# Patient Record
Sex: Female | Born: 1935
Health system: Southern US, Community
[De-identification: ages and names within clinical notes are randomized; demographics above are authoritative.]

## PROBLEM LIST (undated history)

## (undated) DIAGNOSIS — I10 Essential (primary) hypertension: Secondary | ICD-10-CM

## (undated) DIAGNOSIS — K2210 Ulcer of esophagus without bleeding: Secondary | ICD-10-CM

## (undated) DIAGNOSIS — I8390 Asymptomatic varicose veins of unspecified lower extremity: Secondary | ICD-10-CM

## (undated) DIAGNOSIS — K649 Unspecified hemorrhoids: Secondary | ICD-10-CM

## (undated) DIAGNOSIS — M1990 Unspecified osteoarthritis, unspecified site: Secondary | ICD-10-CM

## (undated) DIAGNOSIS — H3530 Unspecified macular degeneration: Secondary | ICD-10-CM

## (undated) DIAGNOSIS — E785 Hyperlipidemia, unspecified: Secondary | ICD-10-CM

## (undated) DIAGNOSIS — K5790 Diverticulosis of intestine, part unspecified, without perforation or abscess without bleeding: Secondary | ICD-10-CM

## (undated) DIAGNOSIS — C801 Malignant (primary) neoplasm, unspecified: Secondary | ICD-10-CM

## (undated) DIAGNOSIS — K449 Diaphragmatic hernia without obstruction or gangrene: Secondary | ICD-10-CM

## (undated) DIAGNOSIS — H409 Unspecified glaucoma: Secondary | ICD-10-CM

## (undated) DIAGNOSIS — K222 Esophageal obstruction: Secondary | ICD-10-CM

## (undated) DIAGNOSIS — E119 Type 2 diabetes mellitus without complications: Secondary | ICD-10-CM

## (undated) DIAGNOSIS — K5190 Ulcerative colitis, unspecified, without complications: Secondary | ICD-10-CM

## (undated) DIAGNOSIS — K635 Polyp of colon: Secondary | ICD-10-CM

## (undated) DIAGNOSIS — H269 Unspecified cataract: Secondary | ICD-10-CM

## (undated) HISTORY — PX: CATARACT EXTRACTION: SUR2

## (undated) HISTORY — PX: COLONOSCOPY: SHX174

## (undated) HISTORY — DX: Diaphragmatic hernia without obstruction or gangrene: K44.9

## (undated) HISTORY — DX: Ulcerative colitis, unspecified, without complications: K51.90

## (undated) HISTORY — PX: WRIST SURGERY: SHX841

## (undated) HISTORY — DX: Essential (primary) hypertension: I10

## (undated) HISTORY — PX: TONSILLECTOMY AND ADENOIDECTOMY: SUR1326

## (undated) HISTORY — PX: TUBAL LIGATION: SHX77

## (undated) HISTORY — DX: Esophageal obstruction: K22.2

## (undated) HISTORY — DX: Ulcer of esophagus without bleeding: K22.10

## (undated) HISTORY — DX: Polyp of colon: K63.5

## (undated) HISTORY — PX: APPENDECTOMY: SHX54

## (undated) HISTORY — DX: Unspecified hemorrhoids: K64.9

## (undated) HISTORY — DX: Diverticulosis of intestine, part unspecified, without perforation or abscess without bleeding: K57.90

---

## 1964-01-21 HISTORY — PX: OVARIAN CYST REMOVAL: SHX89

## 1985-04-05 ENCOUNTER — Encounter (INDEPENDENT_AMBULATORY_CARE_PROVIDER_SITE_OTHER): Payer: Self-pay | Admitting: *Deleted

## 1993-01-09 ENCOUNTER — Encounter: Payer: Self-pay | Admitting: Internal Medicine

## 1993-01-16 ENCOUNTER — Encounter (INDEPENDENT_AMBULATORY_CARE_PROVIDER_SITE_OTHER): Payer: Self-pay | Admitting: *Deleted

## 1993-01-31 ENCOUNTER — Encounter: Payer: Self-pay | Admitting: Internal Medicine

## 1993-03-07 ENCOUNTER — Encounter: Payer: Self-pay | Admitting: Internal Medicine

## 1993-04-04 ENCOUNTER — Encounter: Payer: Self-pay | Admitting: Internal Medicine

## 1993-06-18 ENCOUNTER — Encounter: Payer: Self-pay | Admitting: Internal Medicine

## 1993-08-28 ENCOUNTER — Encounter: Payer: Self-pay | Admitting: Internal Medicine

## 1993-12-25 ENCOUNTER — Encounter: Payer: Self-pay | Admitting: Internal Medicine

## 1994-01-17 ENCOUNTER — Encounter: Payer: Self-pay | Admitting: Internal Medicine

## 1995-06-03 ENCOUNTER — Encounter: Payer: Self-pay | Admitting: Internal Medicine

## 1995-07-06 ENCOUNTER — Encounter: Payer: Self-pay | Admitting: Internal Medicine

## 1995-10-12 ENCOUNTER — Encounter: Payer: Self-pay | Admitting: Internal Medicine

## 1997-05-23 DIAGNOSIS — C4491 Basal cell carcinoma of skin, unspecified: Secondary | ICD-10-CM

## 1997-05-23 HISTORY — DX: Basal cell carcinoma of skin, unspecified: C44.91

## 1997-10-31 ENCOUNTER — Encounter: Payer: Self-pay | Admitting: Internal Medicine

## 1997-11-15 ENCOUNTER — Encounter: Payer: Self-pay | Admitting: Internal Medicine

## 1997-11-15 ENCOUNTER — Other Ambulatory Visit: Admission: RE | Admit: 1997-11-15 | Discharge: 1997-11-15 | Payer: Self-pay | Admitting: Internal Medicine

## 1997-11-16 ENCOUNTER — Encounter: Payer: Self-pay | Admitting: Internal Medicine

## 1998-07-03 ENCOUNTER — Other Ambulatory Visit: Admission: RE | Admit: 1998-07-03 | Discharge: 1998-07-03 | Payer: Self-pay | Admitting: Family Medicine

## 1999-02-13 ENCOUNTER — Encounter: Payer: Self-pay | Admitting: Internal Medicine

## 1999-09-13 ENCOUNTER — Other Ambulatory Visit: Admission: RE | Admit: 1999-09-13 | Discharge: 1999-09-13 | Payer: Self-pay | Admitting: Family Medicine

## 1999-12-31 ENCOUNTER — Encounter: Payer: Self-pay | Admitting: Internal Medicine

## 2000-02-12 ENCOUNTER — Other Ambulatory Visit: Admission: RE | Admit: 2000-02-12 | Discharge: 2000-02-12 | Payer: Self-pay | Admitting: Internal Medicine

## 2000-02-12 ENCOUNTER — Encounter (INDEPENDENT_AMBULATORY_CARE_PROVIDER_SITE_OTHER): Payer: Self-pay

## 2000-02-12 ENCOUNTER — Encounter: Payer: Self-pay | Admitting: Internal Medicine

## 2000-09-14 ENCOUNTER — Other Ambulatory Visit: Admission: RE | Admit: 2000-09-14 | Discharge: 2000-09-14 | Payer: Self-pay | Admitting: Family Medicine

## 2001-07-13 ENCOUNTER — Encounter: Payer: Self-pay | Admitting: Internal Medicine

## 2001-09-16 ENCOUNTER — Other Ambulatory Visit: Admission: RE | Admit: 2001-09-16 | Discharge: 2001-09-16 | Payer: Self-pay | Admitting: Family Medicine

## 2002-02-23 ENCOUNTER — Encounter: Payer: Self-pay | Admitting: Internal Medicine

## 2002-04-06 ENCOUNTER — Encounter: Payer: Self-pay | Admitting: Internal Medicine

## 2002-08-09 DIAGNOSIS — C4491 Basal cell carcinoma of skin, unspecified: Secondary | ICD-10-CM

## 2002-08-09 HISTORY — DX: Basal cell carcinoma of skin, unspecified: C44.91

## 2002-09-19 ENCOUNTER — Other Ambulatory Visit: Admission: RE | Admit: 2002-09-19 | Discharge: 2002-09-19 | Payer: Self-pay | Admitting: Family Medicine

## 2002-10-17 ENCOUNTER — Encounter: Admission: RE | Admit: 2002-10-17 | Discharge: 2002-10-17 | Payer: Self-pay | Admitting: Family Medicine

## 2002-10-17 ENCOUNTER — Encounter: Payer: Self-pay | Admitting: Family Medicine

## 2003-10-10 ENCOUNTER — Encounter: Payer: Self-pay | Admitting: Internal Medicine

## 2003-11-03 ENCOUNTER — Other Ambulatory Visit: Admission: RE | Admit: 2003-11-03 | Discharge: 2003-11-03 | Payer: Self-pay | Admitting: Family Medicine

## 2004-04-18 ENCOUNTER — Ambulatory Visit: Payer: Self-pay | Admitting: Internal Medicine

## 2004-04-29 ENCOUNTER — Ambulatory Visit: Payer: Self-pay | Admitting: Internal Medicine

## 2004-09-17 DIAGNOSIS — C4491 Basal cell carcinoma of skin, unspecified: Secondary | ICD-10-CM

## 2004-09-17 HISTORY — DX: Basal cell carcinoma of skin, unspecified: C44.91

## 2005-01-27 ENCOUNTER — Encounter: Admission: RE | Admit: 2005-01-27 | Discharge: 2005-01-27 | Payer: Self-pay | Admitting: Family Medicine

## 2006-03-03 ENCOUNTER — Other Ambulatory Visit: Admission: RE | Admit: 2006-03-03 | Discharge: 2006-03-03 | Payer: Self-pay | Admitting: Family Medicine

## 2006-03-23 ENCOUNTER — Ambulatory Visit: Payer: Self-pay | Admitting: Internal Medicine

## 2006-04-30 ENCOUNTER — Ambulatory Visit: Payer: Self-pay | Admitting: Internal Medicine

## 2006-05-14 ENCOUNTER — Encounter: Payer: Self-pay | Admitting: Internal Medicine

## 2006-05-14 ENCOUNTER — Ambulatory Visit: Payer: Self-pay | Admitting: Internal Medicine

## 2007-05-27 ENCOUNTER — Encounter: Payer: Self-pay | Admitting: Internal Medicine

## 2007-05-27 ENCOUNTER — Ambulatory Visit: Payer: Self-pay | Admitting: Internal Medicine

## 2007-05-27 DIAGNOSIS — K649 Unspecified hemorrhoids: Secondary | ICD-10-CM | POA: Insufficient documentation

## 2007-05-27 DIAGNOSIS — Z8719 Personal history of other diseases of the digestive system: Secondary | ICD-10-CM | POA: Insufficient documentation

## 2007-05-27 DIAGNOSIS — K5190 Ulcerative colitis, unspecified, without complications: Secondary | ICD-10-CM | POA: Insufficient documentation

## 2007-05-27 DIAGNOSIS — D126 Benign neoplasm of colon, unspecified: Secondary | ICD-10-CM | POA: Insufficient documentation

## 2008-06-22 ENCOUNTER — Telehealth: Payer: Self-pay | Admitting: Internal Medicine

## 2008-07-27 ENCOUNTER — Telehealth: Payer: Self-pay | Admitting: Internal Medicine

## 2009-01-20 DIAGNOSIS — H409 Unspecified glaucoma: Secondary | ICD-10-CM

## 2009-01-20 HISTORY — DX: Unspecified glaucoma: H40.9

## 2009-05-03 ENCOUNTER — Encounter: Payer: Self-pay | Admitting: Internal Medicine

## 2009-05-03 ENCOUNTER — Ambulatory Visit: Payer: Self-pay | Admitting: Internal Medicine

## 2009-05-03 DIAGNOSIS — K5190 Ulcerative colitis, unspecified, without complications: Secondary | ICD-10-CM | POA: Insufficient documentation

## 2009-05-03 DIAGNOSIS — R195 Other fecal abnormalities: Secondary | ICD-10-CM | POA: Insufficient documentation

## 2009-05-03 DIAGNOSIS — R198 Other specified symptoms and signs involving the digestive system and abdomen: Secondary | ICD-10-CM | POA: Insufficient documentation

## 2009-05-03 LAB — CONVERTED CEMR LAB
Alkaline Phosphatase: 53 units/L (ref 39–117)
BUN: 13 mg/dL (ref 6–23)
Basophils Absolute: 0 10*3/uL (ref 0.0–0.1)
Basophils Relative: 0.3 % (ref 0.0–3.0)
CO2: 29 meq/L (ref 19–32)
Chloride: 108 meq/L (ref 96–112)
Creatinine, Ser: 0.8 mg/dL (ref 0.4–1.2)
Eosinophils Absolute: 0.2 10*3/uL (ref 0.0–0.7)
Eosinophils Relative: 3.2 % (ref 0.0–5.0)
GFR calc non Af Amer: 74.45 mL/min (ref >60)
HCT: 36.5 % (ref 36.0–46.0)
Hemoglobin: 12.8 g/dL (ref 12.0–15.0)
Lymphs Abs: 1.5 10*3/uL (ref 0.7–4.0)
MCHC: 35 g/dL (ref 30.0–36.0)
MCV: 92.4 fL (ref 78.0–100.0)
Monocytes Absolute: 0.6 10*3/uL (ref 0.1–1.0)
Neutro Abs: 3.4 10*3/uL (ref 1.4–7.7)
RBC: 3.95 M/uL (ref 3.87–5.11)
Total Bilirubin: 0.4 mg/dL (ref 0.3–1.2)
Total Protein: 7 g/dL (ref 6.0–8.3)
WBC: 5.6 10*3/uL (ref 4.5–10.5)

## 2009-06-05 ENCOUNTER — Ambulatory Visit: Payer: Self-pay | Admitting: Internal Medicine

## 2009-06-08 ENCOUNTER — Encounter: Payer: Self-pay | Admitting: Internal Medicine

## 2009-09-14 ENCOUNTER — Other Ambulatory Visit: Admission: RE | Admit: 2009-09-14 | Discharge: 2009-09-14 | Payer: Self-pay | Admitting: Geriatric Medicine

## 2010-02-21 NOTE — Progress Notes (Signed)
Summary: [facility]  [facility]   Imported By: Lanelle Bal 05/04/2009 13:38:42  _____________________________________________________________________  External Attachment:    Type:   Image     Comment:   External Document

## 2010-02-21 NOTE — Progress Notes (Signed)
Summary: [facility]  [facility]   Imported By: Lanelle Bal 05/04/2009 13:30:03  _____________________________________________________________________  External Attachment:    Type:   Image     Comment:   External Document

## 2010-02-21 NOTE — Procedures (Signed)
Summary: Colonoscopy   Colonoscopy  Procedure date:  04/29/2004  Findings:      Location:  [facility].  Results: Colitis.        Patient Name: Rachel Henry, Rachel Henry. MRN:  Procedure Procedures: Colonoscopy CPT: [phone].    with biopsy. CPT: [id number].    with polypectomy. CPT: [id number].  Personnel: Endoscopist: Wilhemina Bonito. Marina Goodell, MD.  Exam Location: Exam performed in Outpatient Clinic. Outpatient  Patient Consent: Procedure, Alternatives, Risks and Benefits discussed, consent obtained, from patient. Consent was obtained by the RN.  Indications  Surveillance of: Adenomatous Polyp(s). Ulcerative Colitis.  History  Current Medications: Patient is not currently taking Coumadin.  Pre-Exam Physical: Performed Apr 29, 2004. Entire physical exam was normal.  Exam Exam: Extent of exam reached: Cecum, extent intended: Cecum.  The cecum was identified by appendiceal orifice and IC valve. Patient position: from side to side. Colon retroflexion performed. Images taken. ASA Classification: II. Tolerance: excellent.  Monitoring: Pulse and BP monitoring, Oximetry used. Supplemental O2 given.  Colon Prep Used MIRALAX for colon prep. Prep results: excellent.  Sedation Meds: Patient assessed and found to be appropriate for moderate (conscious) sedation. Fentanyl 75 mcg. given IV. Versed 10 mg. given IV.  Findings POLYP: Descending Colon, Maximum size: 2 mm. diminutive, Procedure:  biopsy without cautery, removed, retrieved, Polyp sent to pathology. ICD9: Colon Polyps: 211.3.  NORMAL EXAM: Cecum to Rectum. Biopsy/Normal Exam taken. Comments: BX x 8 from 4 areas of colon.  Internal hemorrhoids seen.   Assessment Abnormal examination, see findings above.  Diagnoses: 211.3: Colon Polyps.  556.4: Colitis, Pancolitic, Universal.   Events  Unplanned Interventions: No intervention was required.  Unplanned Events: There were no complications. Plans Medication Plan: Continue  current medications.  Disposition: After procedure patient sent to recovery. After recovery patient sent home.  Scheduling/Referral: Colonoscopy, to Wilhemina Bonito. Marina Goodell, MD, in 2 years,    This report was created from the original endoscopy report, which was reviewed and signed by the above listed endoscopist.   cc:  Bradd Canary, MD      The Patient

## 2010-02-21 NOTE — Progress Notes (Signed)
Summary: [facility]  [facility]   Imported By: Lanelle Bal 05/04/2009 13:45:51  _____________________________________________________________________  External Attachment:    Type:   Image     Comment:   External Document

## 2010-02-21 NOTE — Progress Notes (Signed)
Summary: [facility]  [facility]   Imported By: Lanelle Bal 05/04/2009 13:35:32  _____________________________________________________________________  External Attachment:    Type:   Image     Comment:   External Document

## 2010-02-21 NOTE — Procedures (Signed)
Summary: Upper Endoscopy  Patient: [name] Note: All result statuses are Final unless otherwise noted.  Tests: (1) Upper Endoscopy (EGD)   EGD Upper Endoscopy       DONE     [facility]     [address]     [place], [place]  [address]           ENDOSCOPY PROCEDURE REPORT           PATIENT:  Rachel Henry, Rachel Henry  MR#:  [id number]     BIRTHDATE:  [birth year], 76 yrs. old  GENDER:  female           ENDOSCOPIST:  Wilhemina Bonito. Eda Keys, MD     Referred by:  Office           PROCEDURE DATE:  06/05/2009     PROCEDURE:  EGD, diagnostic     ASA CLASS:  Class II     INDICATIONS:  hemeoccult positive stool ; ? melena           MEDICATIONS:   There was residual sedation effect present from     prior procedure., Fentanyl 25 mcg IV, Versed 1 mg IV     TOPICAL ANESTHETIC:  Exactacain Spray           DESCRIPTION OF PROCEDURE:   After the risks benefits and     alternatives of the procedure were thoroughly explained, informed     consent was obtained.  The LB GIF-H180 [id number] endoscope was     introduced through the mouth and advanced to the second portion of     the duodenum, without limitations.  The instrument was slowly     withdrawn as the mucosa was fully examined.     <<PROCEDUREIMAGES>>           Ulcerative Esophagitis was found in the distal third of esophagus.     As well, a peptic stricture was found in the distal esophagus.     The upper and middle third of the esophagus were carefully     inspected and no additional abnormalities were noted. The z-line     was inflammed but well seen at the GEJ. The endoscope was pushed     into the fundus which was normal including a retroflexed view. The     antrum,gastric body, first and second part of the duodenum were     unremarkable.    Retroflexed views revealed a hiatal hernia.     The scope was then withdrawn from the patient and the procedure     completed.           COMPLICATIONS:  None           ENDOSCOPIC IMPRESSION:   1) Ulcerative Esophagitis in the distal third of the esophagus     2) Stricture in the distal esophagus     3) Normal EGD otherwise     4) A hiatal hernia           RECOMMENDATIONS:     1) Prilosec (omeprazole) 20 mg daily, indefinitely (prescription     or OTC)           ______________________________     Wilhemina Bonito. Eda Keys, MD           CC:  Merlene Laughter, MD, The Patient           n.     eSIGNEDWilhemina Bonito. Eda Keys at 06/05/2009 04:01 PM  Rachel Henry, Rachel Henry, [id number]  Note: An exclamation mark (!) indicates a result that was not dispersed into the flowsheet. Document Creation Date: 06/07/2009 5:07 PM _______________________________________________________________________  (1) Order result status: Final Collection or observation date-time: 06/05/2009 15:51 Requested date-time:  Receipt date-time:  Reported date-time:  Referring Physician:   Ordering Physician: Fransico Setters [phone]) Specimen Source:  Source: Launa Grill Order Number: [id number] Lab site:

## 2010-02-21 NOTE — Progress Notes (Signed)
Summary: [facility]  [facility]   Imported By: Lanelle Bal 05/04/2009 13:43:24  _____________________________________________________________________  External Attachment:    Type:   Image     Comment:   External Document

## 2010-02-21 NOTE — Progress Notes (Signed)
Summary: [facility]  [facility]   Imported By: Lanelle Bal 05/04/2009 13:32:31  _____________________________________________________________________  External Attachment:    Type:   Image     Comment:   External Document

## 2010-02-21 NOTE — Procedures (Signed)
Summary: Colonoscopy/[facility]  Colonoscopy/[facility]   Imported By: Lanelle Bal 05/04/2009 13:51:49  _____________________________________________________________________  External Attachment:    Type:   Image     Comment:   External Document

## 2010-02-21 NOTE — Letter (Signed)
Summary: [facility] Instructions  [facility]  [address] [place], [place] [address]   Phone: [phone]  Fax: [phone]       Rachel Henry    [birth year]    MRN: [id number]        Procedure Day /Date:TUESDAY, 06/05/09     Arrival Time:2:00 PM     Procedure Time:3:00 PM     Location of Procedure:                    X  [facility] (4th Floor)   PREPARATION FOR COLONOSCOPY WITH MOVIPREP/ENDO   Starting 5 days prior to your procedure 05/31/09 do not eat nuts, seeds, popcorn, corn, beans, peas,  salads, or any raw vegetables.  Do not take any fiber supplements (e.g. Metamucil, Citrucel, and Benefiber).  THE DAY BEFORE YOUR PROCEDURE         DATE: 06/04/09  DAY: MONDAY  1.  Drink clear liquids the entire day-NO SOLID FOOD  2.  Do not drink anything colored red or purple.  Avoid juices with pulp.  No orange juice.  3.  Drink at least 64 oz. (8 glasses) of fluid/clear liquids during the day to prevent dehydration and help the prep work efficiently.  CLEAR LIQUIDS INCLUDE: Water Jello Ice Popsicles Tea (sugar ok, no milk/cream) Powdered fruit flavored drinks Coffee (sugar ok, no milk/cream) Gatorade Juice: apple, white grape, white cranberry  Lemonade Clear bullion, consomm, broth Carbonated beverages (any kind) Strained chicken noodle soup Hard Candy                             4.  In the morning, mix first dose of MoviPrep solution:    Empty 1 Pouch A and 1 Pouch B into the disposable container    Add lukewarm drinking water to the top line of the container. Mix to dissolve    Refrigerate (mixed solution should be used within 24 hrs)  5.  Begin drinking the prep at 5:00 p.m. The MoviPrep container is divided by 4 marks.   Every 15 minutes drink the solution down to the next mark (approximately 8 oz) until the full liter is complete.   6.  Follow completed prep with 16 oz of clear liquid of your choice (Nothing red or purple).   Continue to drink clear liquids until bedtime.  7.  Before going to bed, mix second dose of MoviPrep solution:    Empty 1 Pouch A and 1 Pouch B into the disposable container    Add lukewarm drinking water to the top line of the container. Mix to dissolve    Refrigerate  THE DAY OF YOUR PROCEDURE      DATE: 5/17/11DAY: TUESDAY  Beginning at 10:00 a.m. (5 hours before procedure):         1. Every 15 minutes, drink the solution down to the next mark (approx 8 oz) until the full liter is complete.  2. Follow completed prep with 16 oz. of clear liquid of your choice.    3. You may drink clear liquids until 1:00 PM (2 HOURS BEFORE PROCEDURE).   MEDICATION INSTRUCTIONS  Unless otherwise instructed, you should take regular prescription medications with a small sip of water   as early as possible the morning of your procedure.           OTHER INSTRUCTIONS  You will need a responsible adult at least 75 years of  age to accompany you and drive you home.   This person must remain in the waiting room during your procedure.  Wear loose fitting clothing that is easily removed.  Leave jewelry and other valuables at home.  However, you may wish to bring a book to read or  an iPod/MP3 player to listen to music as you wait for your procedure to start.  Remove all body piercing jewelry and leave at home.  Total time from sign-in until discharge is approximately 2-3 hours.  You should go home directly after your procedure and rest.  You can resume normal activities the  day after your procedure.  The day of your procedure you should not:   Drive   Make legal decisions   Operate machinery   Drink alcohol   Return to work  You will receive specific instructions about eating, activities and medications before you leave.    The above instructions have been reviewed and explained to me by   _______________________    I fully understand and can verbalize these instructions  _____________________________ Date _________

## 2010-02-21 NOTE — Miscellaneous (Signed)
Summary: omeprazole order  Clinical Lists Changes  Medications: Added new medication of OMEPRAZOLE 20 MG  CPDR (OMEPRAZOLE) 1 each day 30 minutes before meal - Signed Rx of OMEPRAZOLE 20 MG  CPDR (OMEPRAZOLE) 1 each day 30 minutes before meal;  #30 x 11;  Signed;  Entered by: Alease Frame RN;  Authorized by: Hilarie Fredrickson MD;  Method used: Electronically to [facility] [address] [place], [place], [place]  [address], Ph: [phone], Fax: [phone] Observations: Added new observation of ALLERGY REV: Done (06/05/2009 16:37) Added new observation of NKA: T (06/05/2009 16:37)    Prescriptions: OMEPRAZOLE 20 MG  CPDR (OMEPRAZOLE) 1 each day 30 minutes before meal  #30 x 11   Entered by:   Alease Frame RN   Authorized by:   Hilarie Fredrickson MD   Signed by:   Alease Frame RN on 06/05/2009   Method used:   Electronically to        [facility]* (retail)       [address] [place], [place]  [address]       Ph: [phone]       Fax: [phone]   RxID:   [id number]

## 2010-02-21 NOTE — Procedures (Signed)
Summary: colonoscopy   Colonoscopy  Procedure date:  02/12/2000  Findings:      Pathology:  Hyperplastic polyp.     Location:  [facility].   Patient Name: Rachel Henry, Rachel Henry. MRN:  Procedure Procedures: Colonoscopy CPT: [phone].    with multiple biopsies. CPT: [phone].    with Hot Biopsy(s)CPT: [id number].  Personnel: Endoscopist: Wilhemina Bonito. Marina Goodell, MD.  Referred By: Barbera Setters Artis Flock, MD.  Exam Location: Exam performed in Outpatient Clinic. Outpatient  Patient Consent: Procedure, Alternatives, Risks and Benefits discussed, consent obtained, from patient.  Indications  Evaluation of: Established Ulcerative Colitis.  History  Pre-Exam Physical: Performed Feb 12, 2000. Cardio-pulmonary exam, Rectal exam, HEENT exam , Abdominal exam, Extremity exam, Neurological exam, Mental status exam WNL.  Exam Exam: Extent of exam reached: Cecum, extent intended: Cecum.  The cecum was identified by appendiceal orifice and IC valve. Patient position: on left side. Colon retroflexion performed. Images taken. ASA Classification: II. Tolerance: good.  Monitoring: Pulse and BP monitoring, Oximetry used. Supplemental O2 given.  Sedation Meds: Fentanyl 100 mcg. Versed 9 mg.  Findings - NORMAL EXAM: Cecum to Rectum. Not Seen: Colitis. Comments: Random colon bx taken.  POLYP: Rectum, diminutive, Distance from Anus 15 cm. Procedure:  hot biopsy, removed, retrieved, Polyp sent to pathology. ICD9: Colon Polyps: 211.3.   Assessment Abnormal examination, see findings above.  Diagnoses: 211.3: Colon Polyps.   Events  Unplanned Interventions: No intervention was required.  Unplanned Events: There were no complications. Plans Medication Plan: Await pathology. Continue current medications.  Disposition: After procedure patient sent to recovery. After recovery patient sent home.  Scheduling/Referral: Clinic Visit, to Wilhemina Bonito. Marina Goodell, MD, in 1 year,  Colonoscopy, to Wilhemina Bonito.  Marina Goodell, MD, in 2 years,    This report was created from the original endoscopy report, which was reviewed and signed by the above listed endoscopist.   cc:  Bradd Canary, MD

## 2010-02-21 NOTE — Procedures (Signed)
Summary: colonoscopy   Colonoscopy  Procedure date:  05/14/2006  Findings:      Location:  [facility].  Results: Hemorrhoids.     Results: Colitis.         Procedures Next Due Date:    Colonoscopy: 05/2009 Patient Name: Katelinn, Justice. MRN:  Procedure Procedures: Colonoscopy CPT: [phone].    with multiple biopsies. CPT: [phone]. There were greater than 10 biopsies taken.  Personnel: Endoscopist: Wilhemina Bonito. Marina Goodell, MD.  Exam Location: Exam performed in Outpatient Clinic. Outpatient  Patient Consent: Procedure, Alternatives, Risks and Benefits discussed, consent obtained, from patient. Consent was obtained by the RN.  Indications  Surveillance of: Ulcerative Colitis.  History  Current Medications: Patient is not currently taking Coumadin.  Pre-Exam Physical: Performed May 14, 2006. Cardio-pulmonary exam, Rectal exam, HEENT exam , Abdominal exam, Mental status exam WNL.  Comments: Pt. history reviewed/updated, physical exam performed prior to initiation of sedation?yes Exam Exam: Extent of exam reached: Cecum, extent intended: Cecum.  The cecum was identified by appendiceal orifice and IC valve. Patient position: on left side. Time to Cecum: 00:09:53. Time for Withdrawl: 00:14:50. Colon retroflexion performed. Images taken. ASA Classification: II. Tolerance: excellent.  Monitoring: Pulse and BP monitoring, Oximetry used. Supplemental O2 given.  Colon Prep Used Miralax for colon prep. Prep results: excellent.  Sedation Meds: Patient assessed and found to be appropriate for moderate (conscious) sedation. Fentanyl 100 mcg. given IV. Versed 10 mg. given IV.  Findings NORMAL EXAM: Cecum to Rectum. Biopsy/Normal Exam taken. Comments: 4Q bx q 10cm (32 biopsies).   Assessment  Diagnoses: 455.0: Hemorrhoids, Internal.  556.4: Colitis, Pancolitic, Universal. Quiescent.   Events  Unplanned Interventions: No intervention was required.  Unplanned  Events: There were no complications. Plans Disposition: After procedure patient sent to recovery. After recovery patient sent home.  Scheduling/Referral: Colonoscopy, to Wilhemina Bonito. Marina Goodell, MD, in 2-3 years,    This report was created from the original endoscopy report, which was reviewed and signed by the above listed endoscopist.   cc:  Bradd Canary, MD      The Patient

## 2010-02-21 NOTE — Progress Notes (Signed)
Summary: [facility]  [facility]   Imported By: Lanelle Bal 05/04/2009 13:30:53  _____________________________________________________________________  External Attachment:    Type:   Image     Comment:   External Document

## 2010-02-21 NOTE — Progress Notes (Signed)
Summary: [facility]  [facility]   Imported By: Lanelle Bal 05/04/2009 13:33:18  _____________________________________________________________________  External Attachment:    Type:   Image     Comment:   External Document

## 2010-02-21 NOTE — Letter (Signed)
Summary: Patient Notice- Colon Biospy Results  [facility]  [address] [place], [place] [address]   Phone: [phone]  Fax: [phone]        Jun 08, 2009 MRN: [id number]    Rachel Henry [address] [place], [place]  [address]    Dear Ms. Herber,  I am pleased to inform you that the biopsies taken during your recent colonoscopy were normal and did not show any evidence of active colitis or dysplasia upon pathologic examination.  Additional information/recommendations:  __No further action is needed at this time.  Please follow-up with      your primary care physician for your other healthcare needs.   __You should have a repeat colonoscopy examination for this problem           in 3 years.  Please call us if you are having persistent problems or have questions about your condition that have not been fully answered at this time.  Sincerely,  Hilarie Fredrickson MD   This letter has been electronically signed by your physician.  Appended Document: Patient Notice- Colon Biospy Results letter mailed

## 2010-02-21 NOTE — Progress Notes (Signed)
Summary: [facility]  [facility]   Imported By: Lanelle Bal 05/04/2009 13:34:49  _____________________________________________________________________  External Attachment:    Type:   Image     Comment:   External Document

## 2010-02-21 NOTE — Procedures (Signed)
Summary: Colonoscopy/[facility]  Colonoscopy/[facility]   Imported By: Lanelle Bal 05/04/2009 13:53:20  _____________________________________________________________________  External Attachment:    Type:   Image     Comment:   External Document

## 2010-02-21 NOTE — Progress Notes (Signed)
Summary: [facility]  [facility]   Imported By: Lanelle Bal 05/04/2009 13:37:54  _____________________________________________________________________  External Attachment:    Type:   Image     Comment:   External Document

## 2010-02-21 NOTE — Progress Notes (Signed)
Summary: [facility]  [facility]   Imported By: Lanelle Bal 05/04/2009 13:36:17  _____________________________________________________________________  External Attachment:    Type:   Image     Comment:   External Document

## 2010-02-21 NOTE — Progress Notes (Signed)
Summary: [facility]  [facility]   Imported By: Lanelle Bal 05/04/2009 13:45:07  _____________________________________________________________________  External Attachment:    Type:   Image     Comment:   External Document

## 2010-02-21 NOTE — Procedures (Signed)
Summary: Colonoscopy  Patient: [name] Note: All result statuses are Final unless otherwise noted.  Tests: (1) Colonoscopy (COL)   COL Colonoscopy           DONE     [facility]     [address]     [place], [place]  [address]           COLONOSCOPY PROCEDURE REPORT           PATIENT:  Rachel Henry, Rachel Henry  MR#:  [id number]     BIRTHDATE:  [birth year], 76 yrs. old  GENDER:  female     ENDOSCOPIST:  Wilhemina Bonito. Eda Keys, MD     REF. BY:  Office / recall     PROCEDURE DATE:  06/05/2009     PROCEDURE:  Colonoscopy with biopsies     ASA CLASS:  Class II     INDICATIONS:  surveillance for chronic U.C.     MEDICATIONS:   Fentanyl 75 mcg IV, Versed 9 mg IV, Benadryl 25 mg     IV           DESCRIPTION OF PROCEDURE:   After the risks benefits and     alternatives of the procedure were thoroughly explained, informed     consent was obtained.  Digital rectal exam was performed and     revealed no abnormalities.   The LB CF-H180AL [id number] endoscope     was introduced through the anus and advanced to the cecum, which     was identified by both the appendix and ileocecal valve, without     limitations.Time to cecum = 14:52 min. The quality of the prep was     excellent, using MoviPrep.  The instrument was then slowly     withdrawn (time = 15:11 min) as the colon was fully examined.     <<PROCEDUREIMAGES>>           FINDINGS:  Normal appearing mucosa of the cecum, ileocecal valve,     and appendiceal orifice as well as the ascending, hepatic flexure,     transverse, splenic flexure, descending, sigmoid colon, and rectum     .  Mild diverticulosis was found in the sigmoid colon.     Retroflexed views in the rectum revealed internal hemorrhoids.     MULTIPLE BX (4Q q10CM ; N= 32) TAKEN.   The scope was then     withdrawn from the patient and the procedure completed.           COMPLICATIONS:  None     ENDOSCOPIC IMPRESSION:     1) Normal colonic mucosa endoscopically     2) Mild  diverticulosis in the sigmoid colon     3) Internal hemorrhoids           RECOMMENDATIONS:     1) Follow up colonoscopy in 3 years if no dysplasia on bx     2) EGD today           ______________________________     Wilhemina Bonito. Eda Keys, MD           CC:  Merlene Laughter, MD; The Patient           n.     eSIGNED:   Wilhemina Bonito. Eda Keys at 06/05/2009 03:46 PM           Rose [place]i, [id number]  Note: An exclamation mark (!) indicates a result that was not dispersed into the flowsheet. Document Creation Date: 06/07/2009  5:07 PM _______________________________________________________________________  (1) Order result status: Final Collection or observation date-time: 06/05/2009 15:39 Requested date-time:  Receipt date-time:  Reported date-time:  Referring Physician:   Ordering Physician: Fransico Setters [phone]) Specimen Source:  Source: Launa Grill Order Number: [id number] Lab site:   Appended Document: Colonoscopy recall in 3 yrs/05-2012     Procedures Next Due Date:    Colonoscopy: 05/2012

## 2010-02-21 NOTE — Procedures (Signed)
Summary: Colonoscopy   Colonoscopy  Procedure date:  04/06/2002  Findings:      Location:  [facility].  Pathology:  Hyperplastic polyp.       Procedures Next Due Date:    Colonoscopy: 04/2004 Patient Name: Laylonie, Marzec. MRN:  Procedure Procedures: Colonoscopy CPT: [phone].    with multiple biopsies. CPT: [phone]. There were greater than 10 biopsies taken.    with polypectomy. CPT: [id number].  Personnel: Endoscopist: Wilhemina Bonito. Marina Goodell, MD.  Exam Location: Exam performed in Outpatient Clinic. Outpatient  Patient Consent: Procedure, Alternatives, Risks and Benefits discussed, consent obtained, from patient. Consent was obtained by the RN.  Indications  Surveillance of: Ulcerative Colitis.  History  Pre-Exam Physical: Performed Apr 06, 2002. Entire physical exam was normal.  Exam Exam: Extent of exam reached: Cecum, extent intended: Cecum.  The cecum was identified by appendiceal orifice and IC valve. Patient position: left side to back. Colon retroflexion performed. Images taken. ASA Classification: II. Tolerance: good.  Monitoring: Pulse and BP monitoring, Oximetry used. Supplemental O2 given.  Colon Prep Used Golytely for colon prep. Prep results: excellent.  Sedation Meds: Patient assessed and found to be appropriate for moderate (conscious) sedation. Fentanyl 150 mcg. given IV. Versed 15 given IV.  Comments: Technically difficult to reach cecum Findings - NORMAL EXAM: Hepatic Flexure to Transverse Colon. Biopsy/Normal Exam taken. Comments: BX x 6.  - NORMAL EXAM: Transverse Colon to Splenic Flexure. taken. Comments: Bx x 6.  - NORMAL EXAM: Descending Colon to Sigmoid Colon. taken. Comments: Bx x 6.  - NORMAL EXAM: Cecum to Ascending Colon. Comments: BX x 6 taken.  - NORMAL EXAM: Sigmoid Colon to Rectum. Comments: Bx x 6.   Internal hemorrhoids present.  POLYP: Sigmoid Colon, Maximum size: 8 mm. sessile polyp. Distance from Anus 25 cm.  Procedure:  snare with cautery, removed, retrieved, Polyp sent to pathology. ICD9: Colon Polyps: 211.3. Comments: BX x 4 around polypectomy site.   Assessment Abnormal examination, see findings above.  Diagnoses: 211.3: Colon Polyps.   Events  Unplanned Interventions: No intervention was required.  Unplanned Events: There were no complications. Plans Patient Education: Patient given standard instructions for: Polyps.  Disposition: After procedure patient sent to recovery. After recovery patient sent home.  Scheduling/Referral: Colonoscopy, to Wilhemina Bonito. Marina Goodell, MD,  in 2 years,    This report was created from the original endoscopy report, which was reviewed and signed by the above listed endoscopist.   cc:  Bradd Canary, MD      The Patient

## 2010-02-21 NOTE — Progress Notes (Signed)
Summary: [facility]  [facility]   Imported By: Lanelle Bal 05/04/2009 13:44:13  _____________________________________________________________________  External Attachment:    Type:   Image     Comment:   External Document

## 2010-02-21 NOTE — Assessment & Plan Note (Signed)
Summary: Black stools    History of Present Illness Visit Type: Follow-up Visit Primary GI MD: Yancey Flemings MD Primary Provider: Merlene Laughter, MD Requesting Provider: self Chief Complaint: Black tarry stools x 12month History of Present Illness:   77 year old female with long-standing universal ulcerative colitis, adenomatous colon polyps, and hemorrhoids. She presents today with chief complaint of dark stools of one month's duration. She reports intermittent dark stools that have been dark green and at times tarry. Last documented hemoglobin 2009 was 12.6. She describes a change in bowel habits with some mucus associated with stools. Increased frequency and small volume. No abnormal or rectal pain. Stools are formed. She cannot identify any factor is that exacerbated or relieved problem. She denies new medications or change in diet. She occasionally uses ibuprofen. No upper GI complaints. She has switched primary care providers. She has been compliant with Azulfidine 1 g b.i.d. Her last colonoscopy in 2008 was normal except for internal hemorrhoids. Random biopsies were unremarkable. She is due for surveillance at this time.   GI Review of Systems      Denies abdominal pain, acid reflux, belching, bloating, chest pain, dysphagia with liquids, dysphagia with solids, heartburn, loss of appetite, nausea, vomiting, vomiting blood, weight loss, and  weight gain.      Reports black tarry stools, change in bowel habits, hemorrhoids, and  rectal bleeding.     Denies anal fissure, constipation, diarrhea, diverticulosis, fecal incontinence, heme positive stool, irritable bowel syndrome, jaundice, light color stool, liver problems, and  rectal pain. Preventive Screening-Counseling & Management  Alcohol-Tobacco     Smoking Status: quit      Drug Use:  no.      Current Medications (verified): 1)  Azulfidine 500 Mg  Tabs (Sulfasalazine) .... 2 Tablets By Mouth Two Times A Day 2)  Daily Multi   Tabs  (Multiple Vitamins-Minerals) .... Once Daily 3)  Calcium 600 1500 Mg  Tabs (Calcium Carbonate) .... Once Daily 4)  Coq10 100 Mg  Caps (Coenzyme Q10) .... Once Daily 5)  Vitamin D 400 Unit  Caps (Cholecalciferol) .... Qd 6)  Travatan 0.004 %  Soln (Travoprost) .Marland Kitchen.. 1 Gtt Qpm 7)  Korean Ginseng 100 Mg  Caps (Ginseng) .... Take 2 Caps Daily 8)  B Complex 100   Tabs (B Complex Vitamins) .... Once Daily 9)  Vitamin C 500 Mg  Tabs (Ascorbic Acid) .... Once Daily 10)  Chromium Picolinate Klb6   Tabs (Misc Natural Products) .... Once Daily 11)  Fish Oil   Oil (Fish Oil) .... Qd  Allergies (verified): No Known Drug Allergies  Past History:  Past Medical History: Reviewed history from 05/27/2007 and no changes required. Adenomatous Colon Polyps Hemorrhoids Ulcerative Colitis  Past Surgical History: Reviewed history from 05/02/2009 and no changes required. Ovarian Cyst Tubal Ligation Cataract Surgery T&A  Family History: No FH of Colon Cancer: Family History of Diabetes: Mother Family History of Heart Disease: Father & Mother  Social History: Occupation: Retired Patient is a former smoker.  Alcohol Use - no Daily Caffeine Use Illicit Drug Use - no Smoking Status:  quit Drug Use:  no  Review of Systems       The patient complains of allergy/sinus, arthritis/joint pain, back pain, change in vision, fatigue, night sweats, shortness of breath, sleeping problems, and urine leakage.  The patient denies anemia, anxiety-new, blood in urine, breast changes/lumps, confusion, cough, coughing up blood, depression-new, fainting, fever, headaches-new, hearing problems, heart murmur, heart rhythm changes, itching, menstrual pain, muscle  pains/cramps, nosebleeds, pregnancy symptoms, skin rash, sore throat, swelling of feet/legs, swollen lymph glands, thirst - excessive, urination - excessive, urination changes/pain, vision changes, and voice change.    Vital Signs:  Patient profile:   77 year  old female Height:      63 inches Weight:      164.25 pounds BMI:     29.20 Pulse rate:   68 / minute Pulse rhythm:   regular BP sitting:   140 / 72  (left arm) Cuff size:   regular  Vitals Entered By: June McMurray CMA Duncan Dull) (May 03, 2009 10:20 AM)  Physical Exam  General:  Well developed, well nourished, no acute distress. Head:  Normocephalic and atraumatic. Eyes:  PERRLA, no icterus.conjunctiva are pink Ears:  Normal auditory acuity. Nose:  No deformity, discharge,  or lesions. Mouth:  No deformity or lesions, dentition normal. Neck:  Supple; no masses or thyromegaly. Lungs:  Clear throughout to auscultation. Heart:  Regular rate and rhythm; no murmurs, rubs,  or bruits. Abdomen:  Soft, nontender and nondistended. No masses, hepatosplenomegaly or hernias noted. Normal bowel sounds. Rectal:  no external abnormalities. No tenderness or mass. Scant mucoid blood on the examining finger which is Hemoccult-positive Msk:  Symmetrical with no gross deformities. Normal posture. Pulses:  Normal pulses noted. Extremities:  No clubbing, cyanosis, edema or deformities noted. Neurologic:  Alert and  oriented x4;  grossly normal neurologically. Skin:  Intact without significant lesions or rashes. Cervical Nodes:  No significant cervical adenopathy.no supraclavicular adenopathy Psych:  Alert and cooperative. Normal mood and affect.   Impression & Recommendations:  Problem # 1:  CHANGE IN BOWELS (ICD-787.99) change in bowel habits as manifested by increased frequency, smaller volume, mucus, and dark stools. She may be experiencing a flare of colitis. Not certain to me whether she has had intermittent upper GI bleeding. None evident today. We will evaluate this further with endoscopic evaluations.  Problem # 2:  FECAL OCCULT BLOOD (ICD-792.1) Hemoccult positive stool with change in bowel habits and questionable melena.  Plan: #1. Colonoscopy. The nature of the procedure as well as the  risks, benefits, and alternatives have been reviewed. She understood and agreed to proceed. #2. Movi prep prescribed. The patient instructed on its use #3. Upper endoscopy. The nature of the procedure as well as the risks, benefits, and alternatives have been reviewed. She understood and agreed to proceed  Problem # 3:  ULCERATIVE COLITIS-UNIVERSAL (ICD-556.9) long-standing universal ulcerative colitis. Negative surveillance colonoscopy in 2008. Question flare at this time.  Plan: #1. Continue Azulfidine #2. Prescription for Azulfidine refilled and submitted electronically #3. Colonoscopy with surveillance biopsies  Problem # 4:  ADENOMATOUS COLON POLYPS (ICD-211.3) history of adenomatous polyps. Negative exam in 2008. We'll reassess for possible adenomatous at the time of upcoming colonoscopy  Other Orders: Colon/Endo (Colon/Endo)  Patient Instructions: 1)  Colon/Endo [facility] 06/05/09 3:00 pm arrive at 2:00 pm 2)  Movi prep instructions given to patient and Movi prep Rx. sent to your pharmacy for pick up. 3)  Colonoscopy and Flexible Sigmoidoscopy brochure given.  4)  Upper Endoscopy brochure given.  5)  Refill of Azulfadine sent to your pharmacy also for pick up #60 x 11 RFs. 6)  The medication list was reviewed and reconciled.  All changed / newly prescribed medications were explained.  A complete medication list was provided to the patient / caregiver. 7)  printed and given to patient. Rachel Henry  May 03, 2009 11:08 AM 8)  copy: Dr.  HAL Stoneking Prescriptions: MOVIPREP 100 GM  SOLR (PEG-KCL-NACL-NASULF-NA ASC-C) As per prep instructions.  #1 x 0   Entered by:   Milford Cage Henry   Authorized by:   Hilarie Fredrickson MD   Signed by:   Vetra Shinall Henry on 05/03/2009   Method used:   Electronically to        [facility]* (retail)       [address] [place], [place]  [address]       Ph: [phone]       Fax: [phone]   RxID:    [id number] AZULFIDINE 500 MG  TABS (SULFASALAZINE) 2 tablets by mouth two times a day  #120 x 11   Entered by:   Milford Cage Henry   Authorized by:   Hilarie Fredrickson MD   Signed by:   Janari Gagner Henry on 05/03/2009   Method used:   Electronically to        [facility]* (retail)       [address] [place], [place]  [address]       Ph: [phone]       Fax: [phone]   RxID:   [id number]   Appended Document: Black stools ADDENDUM: Also ordering CBC and comprehensive metabolic panel today

## 2010-02-21 NOTE — Progress Notes (Signed)
Summary: [facility]  [facility]   Imported By: Lanelle Bal 05/04/2009 13:31:39  _____________________________________________________________________  External Attachment:    Type:   Image     Comment:   External Document

## 2010-02-21 NOTE — Progress Notes (Signed)
Summary: [facility]  [facility]   Imported By: Lanelle Bal 05/04/2009 13:34:05  _____________________________________________________________________  External Attachment:    Type:   Image     Comment:   External Document

## 2010-02-21 NOTE — Procedures (Signed)
Summary: Colonoscopy/[facility]  Colonoscopy/[facility]   Imported By: Lanelle Bal 05/04/2009 13:48:40  _____________________________________________________________________  External Attachment:    Type:   Image     Comment:   External Document

## 2010-02-21 NOTE — Procedures (Signed)
Summary: Colonoscopy/[facility]  Colonoscopy/[facility]   Imported By: Lanelle Bal 05/04/2009 13:49:52  _____________________________________________________________________  External Attachment:    Type:   Image     Comment:   External Document

## 2010-02-21 NOTE — Procedures (Signed)
Summary: Colonoscopy/[facility]  Colonoscopy/[facility]   Imported By: Lanelle Bal 05/04/2009 13:54:51  _____________________________________________________________________  External Attachment:    Type:   Image     Comment:   External Document

## 2010-05-30 ENCOUNTER — Other Ambulatory Visit: Payer: Self-pay | Admitting: Internal Medicine

## 2010-06-07 NOTE — Assessment & Plan Note (Signed)
[facility]                         GASTROENTEROLOGY OFFICE NOTE   KAYDAN, WILHOITE                      MRN:          [id number]  DATE:03/23/2006                            DOB:          1932-06-20    PROBLEMS:  1. Universal ulcerative colitis.  2. History of adenomatous polyps.   HISTORY:  Guy comes in today for routine followup, needing a refill  on her Azulfidine.  She has not been seen here in a year and a half and  says that she has been doing very well with no active colitis symptoms.  She says every once in a while, her hemorrhoids bother her with a little  bit of bleeding, generally no pain.  Usually, she is having about three  bowel movements per day, denies any abdominal cramping or pain  associated.   CURRENT MEDICATIONS:  1. Azulfidine 500 mg 4 p.o. b.i.d.  2. Vitamin supplements, which include calcium, CoQ 10 and vitamin D.   ALLERGIES:  No known drug allergies.   PHYSICAL EXAMINATION:  GENERAL:  A well-developed white female in no  acute distress.  VITAL SIGNS:  Weight is 172.8.  Blood pressure 138/82, pulse 64.  CARDIOVASCULAR:  Regular rate and rhythm with S1 and S2.  PULMONARY:  Clear to A&P.  ABDOMEN:  Soft and nontender.  There is no palpable mass or  hepatosplenomegaly.  Bowel sounds are active.   IMPRESSION:  26. A 77 year old white female with longstanding universal ulcerative      colitis, stable and relatively asymptomatic.  Due for follow-up      surveillence colonoscopy.  2. A history of adenomatous polyps.   PLAN:  1. Continue Azulfidine at her current dosage, 500 mg, 4 tablets p.o.      b.i.d.  2. Schedule follow-up colonoscopy.      Mike Gip, PA-C  Electronically Signed      Rachel Henry. Marina Goodell, MD  Electronically Signed   AE/MedQ  DD: 03/23/2006  DT: 03/24/2006  Job #: [id number]

## 2010-10-10 ENCOUNTER — Other Ambulatory Visit: Payer: Self-pay | Admitting: Internal Medicine

## 2011-03-29 ENCOUNTER — Other Ambulatory Visit: Payer: Self-pay | Admitting: Internal Medicine

## 2011-05-07 ENCOUNTER — Other Ambulatory Visit: Payer: Self-pay | Admitting: Orthopedic Surgery

## 2011-05-07 ENCOUNTER — Ambulatory Visit
Admission: RE | Admit: 2011-05-07 | Discharge: 2011-05-07 | Disposition: A | Discharge status: Home / Self Care | Payer: Self-pay | Source: Ambulatory Visit | Attending: Orthopedic Surgery | Admitting: Orthopedic Surgery

## 2011-05-07 DIAGNOSIS — M25539 Pain in unspecified wrist: Secondary | ICD-10-CM

## 2011-07-03 ENCOUNTER — Other Ambulatory Visit: Payer: Self-pay | Admitting: Internal Medicine

## 2011-07-04 ENCOUNTER — Telehealth: Payer: Self-pay

## 2011-07-04 NOTE — Telephone Encounter (Signed)
Advised pt she needed an office visit so I could continue to refill her sulfasalazine; pt agreed and appointment was scheduled

## 2011-07-10 ENCOUNTER — Ambulatory Visit (INDEPENDENT_AMBULATORY_CARE_PROVIDER_SITE_OTHER): Payer: Medicare Other | Admitting: Internal Medicine

## 2011-07-10 ENCOUNTER — Encounter: Payer: Self-pay | Admitting: Internal Medicine

## 2011-07-10 VITALS — BP 138/72 | HR 67 | Ht 63.0 in | Wt 173.0 lb

## 2011-07-10 DIAGNOSIS — K648 Other hemorrhoids: Secondary | ICD-10-CM

## 2011-07-10 DIAGNOSIS — K219 Gastro-esophageal reflux disease without esophagitis: Secondary | ICD-10-CM

## 2011-07-10 DIAGNOSIS — K5190 Ulcerative colitis, unspecified, without complications: Secondary | ICD-10-CM

## 2011-07-10 MED ORDER — SULFASALAZINE 500 MG PO TABS: 1000.0000 mg | ORAL_TABLET | Freq: Two times a day (BID) | ORAL | Status: DC

## 2011-07-10 NOTE — Patient Instructions (Addendum)
Please pick up your prescription of Sulfasalazine at your pharmacy  Use Prilosec OTC as directed  Follow up in a year

## 2011-07-10 NOTE — Progress Notes (Signed)
HISTORY OF PRESENT ILLNESS:  Rachel Henry is a 78 y.o. female with the below listed medical history who is followed in this office for long-standing universal ulcerative colitis, adenomatous colon polyps, hemorrhoids, and GERD complicated by erosive esophagitis. She was last seen in April 2011 regarding Hemoccult-positive stool and possible melena. In May of 2011 she underwent both surveillance colonoscopy and upper endoscopy. Colonoscopy revealed normal colonic mucosa endoscopically and microscopically. Mild diverticulosis noted incidentally as well as internal hemorrhoids. Routine followup in 3 years recommended. She continues on Azulfidine 1 g twice a day. She requests medication refill. She denies abdominal pain, change in bowel habits, or blood in her stool. Some minor hemorrhoidal bleeding. Her upper endoscopy revealed erosive esophagitis involving the distal third of the esophagus. She was prescribed omeprazole 20 mg daily to be taken indefinitely. At some point, she decided to take her self off the medication. She does have occasional heartburn, though not severe. No dysphagia. No other GI complaints or issues. She does have annual blood work with Dr. Pete Glatter. She tells me that this has been unremarkable except for mildly elevated glucose.  REVIEW OF SYSTEMS:  All non-GI ROS negative except for occasional joint aches  Past Medical History  Diagnosis Date  . Diverticulosis   . Hemorrhoids   . Hiatal hernia   . Esophageal stricture   . Ulcerative esophagitis   . Colon polyps     adenomatous  . Ulcerative colitis     Past Surgical History  Procedure Date  . Tubal ligation   . Cataract extraction   . Tonsillectomy and adenoidectomy   . Ovarian cyst removal   . Wrist surgery     Social History Rachel Henry  reports that she has quit smoking. She has never used smokeless tobacco. She reports that she does not drink alcohol or use illicit drugs.  family history includes  Diabetes in her mother and Heart disease in her father and mother.  There is no history of Colon cancer.  No Known Allergies     PHYSICAL EXAMINATION:  Vital signs: BP 138/72  Pulse 67  Ht 5\' 3"  (1.6 m)  Wt 173 lb (78.472 kg)  BMI 30.65 kg/m2 General: Well-developed, well-nourished, no acute distress HEENT: Sclerae are anicteric, conjunctiva pink. Oral mucosa intact Lungs: Clear Heart: Regular Abdomen: soft, nontender, nondistended, no obvious ascites, no peritoneal signs, normal bowel sounds. No organomegaly. Extremities: No edema Psychiatric: alert and oriented x3. Cooperative     ASSESSMENT:  #1. History of long-standing universal ulcerative colitis. Has been quiescent for years it is also been. Last surveillance colonoscopy normal #2. GERD complicated by erosive esophagitis. Patient has taken herself off PPI. We discussed the pros and cons of PPI therapy in a patient with erosive esophagitis #3. Internal hemorrhoids. Occasional bleeding   PLAN:  #1. Continue Azulfidine. Prescription refilled #2. Resume omeprazole 20 mg daily. The patient prefers obtain this OTC #3. Hemorrhoid care and medicated suppositories as needed #4. Surveillance colonoscopy due around May of 2014. Interval followup as needed

## 2011-12-02 ENCOUNTER — Other Ambulatory Visit: Payer: Self-pay | Admitting: Internal Medicine

## 2012-01-06 ENCOUNTER — Other Ambulatory Visit: Payer: Self-pay | Admitting: Dermatology

## 2012-01-21 DIAGNOSIS — H3530 Unspecified macular degeneration: Secondary | ICD-10-CM

## 2012-01-21 HISTORY — DX: Unspecified macular degeneration: H35.30

## 2012-01-22 ENCOUNTER — Other Ambulatory Visit: Payer: Self-pay | Admitting: Dermatology

## 2012-04-15 ENCOUNTER — Other Ambulatory Visit: Payer: Self-pay | Admitting: Internal Medicine

## 2012-05-03 ENCOUNTER — Encounter: Payer: Self-pay | Admitting: Internal Medicine

## 2012-07-19 ENCOUNTER — Encounter: Payer: Self-pay | Admitting: Internal Medicine

## 2012-07-29 ENCOUNTER — Other Ambulatory Visit: Payer: Self-pay | Admitting: Internal Medicine

## 2012-09-10 ENCOUNTER — Encounter: Payer: Medicare Other | Admitting: Internal Medicine

## 2012-11-29 ENCOUNTER — Ambulatory Visit: Payer: Self-pay | Admitting: Podiatry

## 2012-12-20 ENCOUNTER — Ambulatory Visit (INDEPENDENT_AMBULATORY_CARE_PROVIDER_SITE_OTHER): Payer: Medicare Other | Admitting: Internal Medicine

## 2012-12-20 ENCOUNTER — Encounter: Payer: Self-pay | Admitting: Internal Medicine

## 2012-12-20 VITALS — BP 138/78 | HR 74 | Ht 62.0 in | Wt 168.0 lb

## 2012-12-20 DIAGNOSIS — K649 Unspecified hemorrhoids: Secondary | ICD-10-CM

## 2012-12-20 DIAGNOSIS — K529 Noninfective gastroenteritis and colitis, unspecified: Secondary | ICD-10-CM

## 2012-12-20 DIAGNOSIS — K5190 Ulcerative colitis, unspecified, without complications: Secondary | ICD-10-CM

## 2012-12-20 DIAGNOSIS — K5289 Other specified noninfective gastroenteritis and colitis: Secondary | ICD-10-CM

## 2012-12-20 MED ORDER — RESTORA PO CAPS: 1.0000 | ORAL_CAPSULE | Freq: Every day | ORAL | Status: DC

## 2012-12-20 MED ORDER — HYDROCORTISONE ACETATE 25 MG RE SUPP: 25.0000 mg | Freq: Every day | RECTAL | Status: DC

## 2012-12-20 MED ORDER — SULFASALAZINE 500 MG PO TABS: ORAL_TABLET | ORAL | Status: DC

## 2012-12-20 NOTE — Patient Instructions (Signed)
We have sent the following medications to your pharmacy for you to pick up at your convenience:  Sulfasalazine, Anusol HC Suppositories  You have been given some samples of Restora.  Take one a day until they are gone   Please follow up with Dr. Marina Goodell in one year

## 2012-12-20 NOTE — Progress Notes (Signed)
HISTORY OF PRESENT ILLNESS:  Rachel Henry is a 79 y.o. female with the below listed medical history who is followed in this office first long-standing universal ulcerative colitis, adenomatous colon polyps, hemorrhoids, and GERD complicated by erosive esophagitis. She was last seen in June 2013. See that dictation. She has continued on Azulfidine 1 g twice daily until approximately 1 month ago when she ran out of medication. She presents now complaining of problems with acute illness manifested by nausea, diarrhea, one episode of vomiting, and minor bleeding. This was significant 2 weeks ago. Since, has improved but not resolved. She does not think that this is a typical colitis flare. Her last colonoscopy was performed may 2011. This was normal except for mild sigmoid diverticulosis and internal hemorrhoids. She is having some difficulty with her hemorrhoids as manifested by prolapse. She is accompanied by her husband  REVIEW OF SYSTEMS:  All non-GI ROS negative except for sinus and allergy trouble, arthritis, back pain, cough, fatigue, shortness of breath, sore throat, voice change, leg swelling  Past Medical History  Diagnosis Date  . Diverticulosis   . Hemorrhoids   . Hiatal hernia   . Esophageal stricture   . Ulcerative esophagitis   . Colon polyps     adenomatous  . Ulcerative colitis     Past Surgical History  Procedure Laterality Date  . Tubal ligation    . Cataract extraction    . Tonsillectomy and adenoidectomy    . Ovarian cyst removal    . Wrist surgery      Social History [name]  reports that she has quit smoking. She has never used smokeless tobacco. She reports that she does not drink alcohol or use illicit drugs.  family history includes Diabetes in her mother; Heart disease in her father and mother. There is no history of Colon cancer.  No Known Allergies     PHYSICAL EXAMINATION: Vital signs: BP 138/78  Pulse 74  Ht 5\' 2"  (1.575 m)  Wt 168 lb  (76.204 kg)  BMI 30.72 kg/m2  SpO2 98% General: Well-developed, well-nourished, no acute distress HEENT: Sclerae are anicteric, conjunctiva pink. Oral mucosa intact Lungs: Clear Heart: Regular Abdomen: soft, nontender, nondistended, no obvious ascites, no peritoneal signs, normal bowel sounds. No organomegaly. Extremities: No edema Psychiatric: alert and oriented x3. Cooperative   ASSESSMENT:  #1. Probable acute illness. Slowly improving #2. History of long-standing ulcerative colitis. Last colonoscopy 2011 was unremarkable #3. History of GERD complicated by peptic stricture and erosive esophagitis. Not on PPI #4. History of adenomatous colon polyps, sporadic #5. Symptomatic hemorrhoids   PLAN:  #1. Refill and resume sulfasalazine #2. Probiotic samples provided for a few weeks #3. The patient is not interested in surveillance colonoscopy. Reasonable given her age #84. Recommend resuming PPI for history of erosive esophagitis and peptic stricture #5. Prescribe Anusol-HC suppositories for hemorrhoids #6. Routine followup in 1 year. Sooner if clinically indicated.

## 2013-05-06 ENCOUNTER — Other Ambulatory Visit: Payer: Self-pay | Admitting: Internal Medicine

## 2013-06-29 ENCOUNTER — Encounter: Payer: Self-pay | Admitting: Internal Medicine

## 2013-07-06 ENCOUNTER — Telehealth: Payer: Self-pay | Admitting: Internal Medicine

## 2013-07-06 ENCOUNTER — Encounter: Payer: Self-pay | Admitting: Internal Medicine

## 2013-07-06 NOTE — Telephone Encounter (Signed)
Needs recall colon, letter was mailed out 06/29/13

## 2013-07-11 ENCOUNTER — Ambulatory Visit (AMBULATORY_SURGERY_CENTER): Payer: Self-pay | Admitting: *Deleted

## 2013-07-11 VITALS — Ht 62.0 in | Wt 171.0 lb

## 2013-07-11 DIAGNOSIS — K5190 Ulcerative colitis, unspecified, without complications: Secondary | ICD-10-CM

## 2013-07-11 MED ORDER — MOVIPREP 100 G PO SOLR
ORAL | Status: DC
Start: 2013-07-11 — End: 2013-07-27

## 2013-07-11 NOTE — Progress Notes (Signed)
Patient denies any allergies to eggs or soy. Patient denies any problems with anesthesia/sedation. Patient denies any oxygen use at home and does not take any diet/weight loss medications. EMMI education assisgned to patient on colonoscopy, this was explained and instructions given to patient. 

## 2013-07-18 ENCOUNTER — Encounter: Payer: Self-pay | Admitting: Internal Medicine

## 2013-07-19 ENCOUNTER — Other Ambulatory Visit: Payer: Self-pay | Admitting: Internal Medicine

## 2013-07-27 ENCOUNTER — Encounter: Payer: Self-pay | Admitting: Internal Medicine

## 2013-07-27 ENCOUNTER — Ambulatory Visit (AMBULATORY_SURGERY_CENTER): Payer: Medicare Other | Admitting: Internal Medicine

## 2013-07-27 VITALS — BP 170/78 | HR 58 | Temp 96.9°F | Resp 13 | Ht 62.0 in | Wt 171.0 lb

## 2013-07-27 DIAGNOSIS — Z86010 Personal history of colonic polyps: Secondary | ICD-10-CM

## 2013-07-27 DIAGNOSIS — Z1211 Encounter for screening for malignant neoplasm of colon: Secondary | ICD-10-CM

## 2013-07-27 DIAGNOSIS — K5190 Ulcerative colitis, unspecified, without complications: Secondary | ICD-10-CM

## 2013-07-27 DIAGNOSIS — D126 Benign neoplasm of colon, unspecified: Secondary | ICD-10-CM

## 2013-07-27 MED ORDER — SODIUM CHLORIDE 0.9 % IV SOLN: 500.0000 mL | INTRAVENOUS | Status: DC

## 2013-07-27 NOTE — Op Note (Signed)
[place]  [employer]. [place], [id number]   COLONOSCOPY PROCEDURE REPORT  PATIENT: Rachel Henry, Rachel Henry.  MR#: [id number] BIRTHDATE: 26-Jan-1932 , 80  yrs. old GENDER: Female ENDOSCOPIST: Eustace Quail, MD REFERRED HU:[id number] Program Recall PROCEDURE DATE:  07/27/2013 PROCEDURE:   Colonoscopy with biopsy First Screening Colonoscopy - Avg.  risk and is 50 yrs.  old or older - No.  Prior Negative Screening - Now for repeat screening. N/A  History of Adenoma - Now for follow-up colonoscopy & has been > or = to 3 yrs.  N/A  Polyps Removed Today? No.  Recommend repeat exam, <10 yrs? No. ASA CLASS:   Class II INDICATIONS:High risk patient with previously diagnosed UC pancolitis 8+ years and Patient's personal history of adenomatous colon polyps.  last exam 8 2011 was unremarkable MEDICATIONS: MAC sedation, administered by CRNA and propofol (Diprivan) 250mg  IV DESCRIPTION OF PROCEDURE:   After the risks benefits and alternatives of the procedure were thoroughly explained, informed consent was obtained.  A digital rectal exam revealed no abnormalities of the rectum.   The [id number]  endoscope was introduced through the anus and advanced to the cecum, which was identified by both the appendix and ileocecal valve. No adverse events experienced.   The quality of the prep was excellent, using MoviPrep  The instrument was then slowly withdrawn as the colon was fully examined.  COLON FINDINGS: Mild diverticulosis was noted in the sigmoid colon. The colon mucosa was otherwise normal. Multiple biopsies, 4 quadrant every 10 cm, taken.  Retroflexed views revealed internal hemorrhoids. The time to cecum=5 minutes 49 seconds.  Withdrawal time=12 minutes 20 seconds.  The scope was withdrawn and the procedure completed. COMPLICATIONS: There were no complications.  ENDOSCOPIC IMPRESSION: 1.   Mild diverticulosis was noted in the sigmoid colon 2.   The colon mucosa  was otherwise normal  RECOMMENDATIONS: 1.  Await biopsy results 2. Continue current medications 3.  Office followup in 2 years. Sooner if clinically indicated   eSigned:  Eustace Quail, MD 07/27/2013 5:21 PM   cc: The Patient and Lajean Manes, MD

## 2013-07-27 NOTE — Progress Notes (Signed)
Called to room to assist during endoscopic procedure.  Patient ID and intended procedure confirmed with present staff. Received instructions for my participation in the procedure from the performing physician.  

## 2013-07-27 NOTE — Patient Instructions (Signed)
Diverticulosis seen, handout given. Biopsies taken today. Office follow up in 2 years, or sooner if needed. Call us with any questions or concerns. Thank you!  YOU HAD AN ENDOSCOPIC PROCEDURE TODAY AT [facility]: Refer to the procedure report that was given to you for any specific questions about what was found during the examination.  If the procedure report does not answer your questions, please call your gastroenterologist to clarify.  If you requested that your care partner not be given the details of your procedure findings, then the procedure report has been included in a sealed envelope for you to review at your convenience later.  YOU SHOULD EXPECT: Some feelings of bloating in the abdomen. Passage of more gas than usual.  Walking can help get rid of the air that was put into your GI tract during the procedure and reduce the bloating. If you had a lower endoscopy (such as a colonoscopy or flexible sigmoidoscopy) you may notice spotting of blood in your stool or on the toilet paper. If you underwent a bowel prep for your procedure, then you may not have a normal bowel movement for a few days.  DIET: Your first meal following the procedure should be a light meal and then it is ok to progress to your normal diet.  A half-sandwich or bowl of soup is an example of a good first meal.  Heavy or fried foods are harder to digest and may make you feel nauseous or bloated.  Likewise meals heavy in dairy and vegetables can cause extra gas to form and this can also increase the bloating.  Drink plenty of fluids but you should avoid alcoholic beverages for 24 hours.  ACTIVITY: Your care partner should take you home directly after the procedure.  You should plan to take it easy, moving slowly for the rest of the day.  You can resume normal activity the day after the procedure however you should NOT DRIVE or use heavy machinery for 24 hours (because of the sedation medicines used during the test).     SYMPTOMS TO REPORT IMMEDIATELY: A gastroenterologist can be reached at any hour.  During normal business hours, 8:30 AM to 5:00 PM Monday through Friday, call [phone].  After hours and on weekends, please call the GI answering service at [phone] who will take a message and have the physician on call contact you.   Following lower endoscopy (colonoscopy or flexible sigmoidoscopy):  Excessive amounts of blood in the stool  Significant tenderness or worsening of abdominal pains  Swelling of the abdomen that is new, acute  Fever of 100F or higher  Following upper endoscopy (EGD)  Vomiting of blood or coffee ground material  New chest pain or pain under the shoulder blades  Painful or persistently difficult swallowing  New shortness of breath  Fever of 100F or higher  Black, tarry-looking stools  FOLLOW UP: If any biopsies were taken you will be contacted by phone or by letter within the next 1-3 weeks.  Call your gastroenterologist if you have not heard about the biopsies in 3 weeks.  Our staff will call the home number listed on your records the next business day following your procedure to check on you and address any questions or concerns that you may have at that time regarding the information given to you following your procedure. This is a courtesy call and so if there is no answer at the home number and we have not heard from  you through the emergency physician on call, we will assume that you have returned to your regular daily activities without incident.  SIGNATURES/CONFIDENTIALITY: You and/or your care partner have signed paperwork which will be entered into your electronic medical record.  These signatures attest to the fact that that the information above on your After Visit Summary has been reviewed and is understood.  Full responsibility of the confidentiality of this discharge information lies with you and/or your care-partner.

## 2013-07-27 NOTE — Progress Notes (Signed)
A/ox3, pleased with MAC, report to RN 

## 2013-07-28 ENCOUNTER — Telehealth: Payer: Self-pay | Admitting: *Deleted

## 2013-07-28 NOTE — Telephone Encounter (Signed)
  Follow up Call-  Call back number 07/27/2013  Post procedure Call Back phone  # [phone]  Permission to leave phone message Yes     Patient questions:  Do you have a fever, pain , or abdominal swelling? No. Pain Score  0 *  Have you tolerated food without any problems? Yes.    Have you been able to return to your normal activities? Yes.    Do you have any questions about your discharge instructions: Diet   No. Medications  No. Follow up visit  No.  Do you have questions or concerns about your Care? No.  Actions: * If pain score is 4 or above: No action needed, pain <4.

## 2013-08-02 ENCOUNTER — Encounter: Payer: Self-pay | Admitting: Internal Medicine

## 2013-08-08 ENCOUNTER — Encounter (HOSPITAL_COMMUNITY): Payer: Self-pay | Admitting: Pharmacy Technician

## 2013-08-15 NOTE — Patient Instructions (Addendum)
   Your procedure is scheduled on:  Wednesday, August 5  Enter through the [employer] [facility] at: 9 AM Pick up the phone at the desk and dial [phone] and inform us of your arrival.  Please call this number if you have any problems the morning of surgery: [phone]  Remember: Do not eat or drink after midnight: Tuesday Take these medicines the morning of surgery with a SIP OF WATER:  Amlodipine, hctz, lisinopril. zantac   Patient instructed not to taken metformin on day of surgery. . Do not wear jewelry, make-up, or FINGER nail polish No metal in your hair or on your body. Do not wear lotions, powders, perfumes.  You may wear deodorant.  Do not bring valuables to the hospital. Contacts, dentures or bridgework may not be worn into surgery.  Leave suitcase in the car. After Surgery it may be brought to your room. For patients being admitted to the hospital, checkout time is 11:00am the day of discharge.   Home with husband Gaspar Bidding cell [phone].

## 2013-08-16 ENCOUNTER — Encounter (HOSPITAL_COMMUNITY)
Admission: RE | Admit: 2013-08-16 | Discharge: 2013-08-16 | Disposition: A | Discharge status: Home / Self Care | Payer: Medicare Other | Source: Ambulatory Visit | Attending: Obstetrics & Gynecology | Admitting: Obstetrics & Gynecology

## 2013-08-16 ENCOUNTER — Encounter (INDEPENDENT_AMBULATORY_CARE_PROVIDER_SITE_OTHER): Payer: Self-pay

## 2013-08-16 ENCOUNTER — Encounter (HOSPITAL_COMMUNITY): Payer: Self-pay

## 2013-08-16 DIAGNOSIS — Z01812 Encounter for preprocedural laboratory examination: Secondary | ICD-10-CM | POA: Diagnosis present

## 2013-08-16 HISTORY — DX: Hyperlipidemia, unspecified: E78.5

## 2013-08-16 HISTORY — DX: Unspecified osteoarthritis, unspecified site: M19.90

## 2013-08-16 HISTORY — DX: Unspecified cataract: H26.9

## 2013-08-16 HISTORY — DX: Malignant (primary) neoplasm, unspecified: C80.1

## 2013-08-16 HISTORY — DX: Type 2 diabetes mellitus without complications: E11.9

## 2013-08-16 HISTORY — DX: Asymptomatic varicose veins of unspecified lower extremity: I83.90

## 2013-08-16 LAB — CBC
HCT: 35.7 % — ABNORMAL LOW (ref 36.0–46.0)
Hemoglobin: 12.1 g/dL (ref 12.0–15.0)
MCH: 31.7 pg (ref 26.0–34.0)
MCHC: 33.9 g/dL (ref 30.0–36.0)
MCV: 93.5 fL (ref 78.0–100.0)
Platelets: 218 10*3/uL (ref 150–400)
RBC: 3.82 MIL/uL — ABNORMAL LOW (ref 3.87–5.11)
RDW: 13.6 % (ref 11.5–15.5)
WBC: 5 10*3/uL (ref 4.0–10.5)

## 2013-08-16 LAB — BASIC METABOLIC PANEL
ANION GAP: 9 (ref 5–15)
BUN: 15 mg/dL (ref 6–23)
CO2: 31 mEq/L (ref 19–32)
Calcium: 9.5 mg/dL (ref 8.4–10.5)
Chloride: 101 mEq/L (ref 96–112)
Creatinine, Ser: 0.67 mg/dL (ref 0.50–1.10)
GFR calc Af Amer: >90 mL/min (ref >90)
GFR, EST NON AFRICAN AMERICAN: 82 mL/min — AB (ref >90)
Glucose, Bld: 170 mg/dL — ABNORMAL HIGH (ref 70–99)
Potassium: 3.6 mEq/L — ABNORMAL LOW (ref 3.7–5.3)
SODIUM: 141 meq/L (ref 137–147)

## 2013-08-18 ENCOUNTER — Other Ambulatory Visit: Payer: Self-pay | Admitting: Internal Medicine

## 2013-08-22 NOTE — H&P (Signed)
Rachel Henry is an 80 y.o. postmenopausal female who presents for surgical management of her pelvic organ prolapse.  In review, she has noted a significant vaginal bulge that has become progressively worse.  The bulge used to resolve on its own and is worse after activity. She also reports incomplete emptying for the past couple of months. Denies urinary frequency or nocturia. +Urge incontinence. No stress incontinence. No vaginal bleeding or spotting. No vaginal discharge, itching or burning. Pt has been using Premarin cream without any difficulty.  Pertinent Gynecological History: Menses: post-menopausal DES exposure: denies Blood transfusions: none Sexually transmitted diseases: no past history Previous GYN Procedures: ovarian cystectomy  Last mammogram: normal Date: June 2014- [facility] II Last pap: normal Date: September 2011, recent Pap not indicated as pt >65yo OB History: G3, P3   Menstrual History:  Past Medical History  Diagnosis Date  . Diverticulosis   . Hemorrhoids   . Hiatal hernia   . Esophageal stricture   . Ulcerative esophagitis   . Colon polyps     adenomatous  . Ulcerative colitis   . Hypertension   . SVD (spontaneous vaginal delivery)     x 3  . Hyperlipidemia     diet controlled  . Varicose veins   . Arthritis     hands - no meds  . Cancer     skin cancer - face   . Cataract     right eye  . Diabetes mellitus without complication     newly diagnosed    Past Surgical History  Procedure Laterality Date  . Tubal ligation    . Cataract extraction      bilateral  . Tonsillectomy and adenoidectomy    . Ovarian cyst removal  1966  . Wrist surgery      right  . Colonoscopy    . Appendectomy      Family History  Problem Relation Age of Onset  . Diabetes Mother   . Heart disease Mother   . Heart disease Father   . Colon cancer Neg Hx     Social History:  reports that she quit smoking about 28 years ago. Her smoking use included Cigarettes.  She smoked 0.25 packs per day. She has never used smokeless tobacco. She reports that she does not drink alcohol or use illicit drugs.  Allergies: No Known Allergies Medications:  Sulfasalazine 500 MG Tablet 2 tablets twice daily,  Hydrochlorothiazide 12.5MG  Capsule TAKE ONE CAPSULE BY MOUTH EVERY DAY ,  MetFORMIN HCl ER 500 MG Tablet Extended Release 24 Hour 1 tablet with evening meal Once a day Amlodipine Besylate 5 MG Tablet 1 tablet Once a day,  Latanoprost 0.005 % Solution 1 drop into right eye Once a day,  Lisinopril 5 MG Tablet 1 tablet Once a day,  Premarin 0.625 MG/GM Cream 0.5g twice weekl Istalol 0.5 % Solution 1 drop into affected eye twice daily,  Coenzyme Q-10 100 MG Capsule one tablet q. day Calcium 600 MG Tablet one tablet daily,  Multiple Vitamin Tablet one tablet daily, Taking  Niacin 400 mg Tablet Extended Release one tablet b.i.d.,  Vitamin C 500 MG Tablet Chewable one tablet daily,  Vitamin D 2000 UNIT Tablet one tablet daily,  Fish Oil 1000 MG Capsule one tablet b.i.d.,   Red Yeast Rice 1200 mg Capsule one tablet once a day, Micro-K 8 MEQ Capsule Extended Release 1 capsule Once a day,  Lutein 20 MG Tablet 1 tablet with a meal Once a day,  Review of Systems  Constitutional: Negative for fever, chills and malaise/fatigue.  HENT: Negative for sore throat.   Eyes: Negative for blurred vision.  Respiratory: Negative for cough, shortness of breath and wheezing.   Cardiovascular: Negative for chest pain, palpitations, orthopnea and claudication.  Gastrointestinal: Negative for heartburn, nausea, vomiting and abdominal pain.  Skin: Negative for itching and rash.  Neurological: Negative for headaches.   Physical Exam Performed in office: 08/15/13 Vitals: Wt 169, Ht 62, BMI 30.91, Temp 97.0, Pulse sitting 76, BP sitting 153/78.   GENERAL APPEARANCE alert, oriented, NAD, pleasant.  SKIN: normal, no rash.  LUNGS: clear to auscultation bilaterally, no wheezes, rhonchi,  rales.  HEART: no murmurs, regular rate and rhythm.  ABDOMEN: soft and not tender, no masses palpated, no rebound, no rigidity.  FEMALE GENITOURINARY: normal external genitalia, labia - unremarkable, visible anterior compartment prolapse at the introitus. No leakge of urine with cough. Normal urethra, vagina - pink moist mucosa with loss of rugae, no lesions or abnormal discharge, cervix - no discharge or lesions or CMT, Stage II uterine prolapse and cystocele, Stage I posterior prolapse.  EXTREMITIES: no edema present, no calf tenderness bilaterally.   LABS:  CBC    Component Value Date/Time   WBC 5.0 08/16/2013 1120   RBC 3.82* 08/16/2013 1120   HGB 12.1 08/16/2013 1120   HCT 35.7* 08/16/2013 1120   PLT 218 08/16/2013 1120   MCV 93.5 08/16/2013 1120   MCH 31.7 08/16/2013 1120   MCHC 33.9 08/16/2013 1120   RDW 13.6 08/16/2013 1120   LYMPHSABS 1.5 05/03/2009 1128   MONOABS 0.6 05/03/2009 1128   EOSABS 0.2 05/03/2009 1128   BASOSABS 0.0 05/03/2009 1128    BUN/Cr: 15/0.67  TVUS: Anteverted uterus measuring 7.6x5x3.4cm, endometrium appears to contain a mass- largest hyperechoic mass ~2x2x1cm- no blood flow noted.  Bilateral ovaries within normal limits.  Assessment/Plan: 80yo G3P3 postmenopausal female who presents for LAVH, BSO, anterior/posterior colporrhaphy due to pelvic organ prolapse- including uterine prolapse, cystocele and rectocele -NPO -LR @ 125cc/hr -Ancef 2g IV to OR -SCDs to OR -Medical clearance obtained by Dr. Felipa Eth.  Janyth Pupa, M 08/22/2013, 2:29 PM

## 2013-08-23 MED ORDER — DEXTROSE 5 % IV SOLN
2.0000 g | INTRAVENOUS | Status: AC
  Administered 2013-08-24: 2 g via INTRAVENOUS
  Filled 2013-08-23: qty 2

## 2013-08-24 ENCOUNTER — Inpatient Hospital Stay (HOSPITAL_COMMUNITY)
Admission: RE | Admit: 2013-08-24 | Discharge: 2013-08-25 | DRG: 742 | Disposition: A | Discharge status: Home / Self Care | Payer: Medicare Other | Source: Ambulatory Visit | Attending: Obstetrics & Gynecology | Admitting: Obstetrics & Gynecology

## 2013-08-24 ENCOUNTER — Encounter (HOSPITAL_COMMUNITY)
Admission: RE | Disposition: A | Discharge status: Home / Self Care | Payer: Self-pay | Source: Ambulatory Visit | Attending: Obstetrics & Gynecology

## 2013-08-24 ENCOUNTER — Ambulatory Visit (HOSPITAL_COMMUNITY): Payer: Medicare Other | Admitting: Anesthesiology

## 2013-08-24 ENCOUNTER — Encounter (HOSPITAL_COMMUNITY): Payer: Self-pay | Admitting: *Deleted

## 2013-08-24 ENCOUNTER — Encounter (HOSPITAL_COMMUNITY): Payer: Medicare Other | Admitting: Anesthesiology

## 2013-08-24 DIAGNOSIS — E119 Type 2 diabetes mellitus without complications: Secondary | ICD-10-CM | POA: Diagnosis present

## 2013-08-24 DIAGNOSIS — D279 Benign neoplasm of unspecified ovary: Secondary | ICD-10-CM | POA: Diagnosis present

## 2013-08-24 DIAGNOSIS — N952 Postmenopausal atrophic vaginitis: Secondary | ICD-10-CM | POA: Diagnosis present

## 2013-08-24 DIAGNOSIS — Z860100 Personal history of colon polyps, unspecified: Secondary | ICD-10-CM

## 2013-08-24 DIAGNOSIS — E785 Hyperlipidemia, unspecified: Secondary | ICD-10-CM | POA: Diagnosis present

## 2013-08-24 DIAGNOSIS — I10 Essential (primary) hypertension: Secondary | ICD-10-CM | POA: Diagnosis present

## 2013-08-24 DIAGNOSIS — K5190 Ulcerative colitis, unspecified, without complications: Secondary | ICD-10-CM | POA: Diagnosis present

## 2013-08-24 DIAGNOSIS — Z85828 Personal history of other malignant neoplasm of skin: Secondary | ICD-10-CM

## 2013-08-24 DIAGNOSIS — Z87891 Personal history of nicotine dependence: Secondary | ICD-10-CM

## 2013-08-24 DIAGNOSIS — N840 Polyp of corpus uteri: Secondary | ICD-10-CM | POA: Diagnosis present

## 2013-08-24 DIAGNOSIS — N819 Female genital prolapse, unspecified: Secondary | ICD-10-CM | POA: Diagnosis present

## 2013-08-24 DIAGNOSIS — D251 Intramural leiomyoma of uterus: Secondary | ICD-10-CM | POA: Diagnosis present

## 2013-08-24 DIAGNOSIS — Z8249 Family history of ischemic heart disease and other diseases of the circulatory system: Secondary | ICD-10-CM

## 2013-08-24 DIAGNOSIS — Z833 Family history of diabetes mellitus: Secondary | ICD-10-CM

## 2013-08-24 DIAGNOSIS — N812 Incomplete uterovaginal prolapse: Principal | ICD-10-CM | POA: Diagnosis present

## 2013-08-24 DIAGNOSIS — K5730 Diverticulosis of large intestine without perforation or abscess without bleeding: Secondary | ICD-10-CM | POA: Diagnosis present

## 2013-08-24 DIAGNOSIS — K449 Diaphragmatic hernia without obstruction or gangrene: Secondary | ICD-10-CM | POA: Diagnosis present

## 2013-08-24 DIAGNOSIS — I8390 Asymptomatic varicose veins of unspecified lower extremity: Secondary | ICD-10-CM | POA: Diagnosis present

## 2013-08-24 DIAGNOSIS — H269 Unspecified cataract: Secondary | ICD-10-CM | POA: Diagnosis present

## 2013-08-24 DIAGNOSIS — K649 Unspecified hemorrhoids: Secondary | ICD-10-CM | POA: Diagnosis present

## 2013-08-24 DIAGNOSIS — N393 Stress incontinence (female) (male): Secondary | ICD-10-CM | POA: Diagnosis present

## 2013-08-24 DIAGNOSIS — K222 Esophageal obstruction: Secondary | ICD-10-CM | POA: Diagnosis present

## 2013-08-24 DIAGNOSIS — K208 Other esophagitis without bleeding: Secondary | ICD-10-CM | POA: Diagnosis present

## 2013-08-24 DIAGNOSIS — N814 Uterovaginal prolapse, unspecified: Secondary | ICD-10-CM | POA: Diagnosis present

## 2013-08-24 HISTORY — DX: Unspecified macular degeneration: H35.30

## 2013-08-24 HISTORY — PX: LAPAROSCOPIC ASSISTED VAGINAL HYSTERECTOMY: SHX5398

## 2013-08-24 HISTORY — DX: Unspecified glaucoma: H40.9

## 2013-08-24 HISTORY — PX: SALPINGOOPHORECTOMY: SHX82

## 2013-08-24 HISTORY — PX: ANTERIOR AND POSTERIOR REPAIR: SHX5121

## 2013-08-24 LAB — GLUCOSE, CAPILLARY
GLUCOSE-CAPILLARY: 99 mg/dL (ref 70–99)
GLUCOSE-CAPILLARY: 164 mg/dL — AB (ref 70–99)
Glucose-Capillary: 149 mg/dL — ABNORMAL HIGH (ref 70–99)
Glucose-Capillary: 138 mg/dL — ABNORMAL HIGH (ref 70–99)

## 2013-08-24 SURGERY — HYSTERECTOMY, VAGINAL, LAPAROSCOPY-ASSISTED
Anesthesia: General | Site: Vagina

## 2013-08-24 MED ORDER — DEXAMETHASONE SODIUM PHOSPHATE 10 MG/ML IJ SOLN
INTRAMUSCULAR | Status: DC | PRN
  Administered 2013-08-24: 6 mg via INTRAVENOUS

## 2013-08-24 MED ORDER — MEPERIDINE HCL 25 MG/ML IJ SOLN: 6.2500 mg | INTRAMUSCULAR | Status: DC | PRN

## 2013-08-24 MED ORDER — KETOROLAC TROMETHAMINE 30 MG/ML IJ SOLN: 15.0000 mg | Freq: Once | INTRAMUSCULAR | Status: DC

## 2013-08-24 MED ORDER — KETOROLAC TROMETHAMINE 30 MG/ML IJ SOLN
15.0000 mg | Freq: Four times a day (QID) | INTRAMUSCULAR | Status: DC
  Filled 2013-08-24 (×2): qty 1

## 2013-08-24 MED ORDER — KETOROLAC TROMETHAMINE 30 MG/ML IJ SOLN: 15.0000 mg | Freq: Once | INTRAMUSCULAR | Status: DC | PRN

## 2013-08-24 MED ORDER — BUPIVACAINE HCL (PF) 0.25 % IJ SOLN
INTRAMUSCULAR | Status: AC
  Filled 2013-08-24: qty 30

## 2013-08-24 MED ORDER — SODIUM CHLORIDE 0.9 % IJ SOLN
INTRAMUSCULAR | Status: AC
  Filled 2013-08-24: qty 10

## 2013-08-24 MED ORDER — FENTANYL CITRATE 0.05 MG/ML IJ SOLN
INTRAMUSCULAR | Status: DC | PRN
  Administered 2013-08-24 (×2): 100 ug via INTRAVENOUS
  Administered 2013-08-24: 25 ug via INTRAVENOUS
  Administered 2013-08-24 (×2): 50 ug via INTRAVENOUS
  Administered 2013-08-24: 25 ug via INTRAVENOUS

## 2013-08-24 MED ORDER — PHENYLEPHRINE 40 MCG/ML (10ML) SYRINGE FOR IV PUSH (FOR BLOOD PRESSURE SUPPORT)
PREFILLED_SYRINGE | INTRAVENOUS | Status: AC
  Filled 2013-08-24: qty 5

## 2013-08-24 MED ORDER — HYDROMORPHONE HCL PF 1 MG/ML IJ SOLN
INTRAMUSCULAR | Status: AC
  Filled 2013-08-24: qty 1

## 2013-08-24 MED ORDER — DOCUSATE SODIUM 100 MG PO CAPS
100.0000 mg | ORAL_CAPSULE | Freq: Two times a day (BID) | ORAL | Status: DC
  Administered 2013-08-24 – 2013-08-25 (×2): 100 mg via ORAL
  Filled 2013-08-24 (×2): qty 1

## 2013-08-24 MED ORDER — VASOPRESSIN 20 UNIT/ML IJ SOLN
INTRAMUSCULAR | Status: AC
  Filled 2013-08-24: qty 1

## 2013-08-24 MED ORDER — FENTANYL CITRATE 0.05 MG/ML IJ SOLN
INTRAMUSCULAR | Status: AC
  Filled 2013-08-24: qty 2

## 2013-08-24 MED ORDER — GLYCOPYRROLATE 0.2 MG/ML IJ SOLN
INTRAMUSCULAR | Status: AC
  Filled 2013-08-24: qty 3

## 2013-08-24 MED ORDER — LIDOCAINE HCL (CARDIAC) 20 MG/ML IV SOLN
INTRAVENOUS | Status: AC
  Filled 2013-08-24: qty 5

## 2013-08-24 MED ORDER — LIDOCAINE-EPINEPHRINE 1 %-1:100000 IJ SOLN
INTRAMUSCULAR | Status: AC
  Filled 2013-08-24: qty 1

## 2013-08-24 MED ORDER — PROMETHAZINE HCL 25 MG/ML IJ SOLN: 6.2500 mg | INTRAMUSCULAR | Status: DC | PRN

## 2013-08-24 MED ORDER — OXYCODONE-ACETAMINOPHEN 5-325 MG PO TABS: 1.0000 | ORAL_TABLET | ORAL | Status: DC | PRN

## 2013-08-24 MED ORDER — KETOROLAC TROMETHAMINE 30 MG/ML IJ SOLN
15.0000 mg | Freq: Four times a day (QID) | INTRAMUSCULAR | Status: DC
  Administered 2013-08-24: 15 mg via INTRAVENOUS
  Filled 2013-08-24: qty 1

## 2013-08-24 MED ORDER — LIDOCAINE-EPINEPHRINE 1 %-1:100000 IJ SOLN
INTRAMUSCULAR | Status: DC | PRN
  Administered 2013-08-24: 20 mL
  Administered 2013-08-24: 4 mL

## 2013-08-24 MED ORDER — NEOSTIGMINE METHYLSULFATE 10 MG/10ML IV SOLN
INTRAVENOUS | Status: DC | PRN
  Administered 2013-08-24: 2 mg via INTRAVENOUS

## 2013-08-24 MED ORDER — ONDANSETRON HCL 4 MG/2ML IJ SOLN
INTRAMUSCULAR | Status: DC | PRN
  Administered 2013-08-24: 4 mg via INTRAVENOUS

## 2013-08-24 MED ORDER — ONDANSETRON HCL 4 MG/2ML IJ SOLN
INTRAMUSCULAR | Status: AC
  Filled 2013-08-24: qty 2

## 2013-08-24 MED ORDER — SIMETHICONE 80 MG PO CHEW: 80.0000 mg | CHEWABLE_TABLET | Freq: Four times a day (QID) | ORAL | Status: DC | PRN

## 2013-08-24 MED ORDER — EPHEDRINE SULFATE 50 MG/ML IJ SOLN
INTRAMUSCULAR | Status: DC | PRN
  Administered 2013-08-24 (×5): 5 mg via INTRAVENOUS

## 2013-08-24 MED ORDER — HYDROMORPHONE HCL PF 1 MG/ML IJ SOLN
0.2500 mg | INTRAMUSCULAR | Status: DC | PRN
  Administered 2013-08-24: 0.5 mg via INTRAVENOUS

## 2013-08-24 MED ORDER — INSULIN ASPART 100 UNIT/ML ~~LOC~~ SOLN: 0.0000 [IU] | Freq: Three times a day (TID) | SUBCUTANEOUS | Status: DC

## 2013-08-24 MED ORDER — AMLODIPINE BESYLATE 5 MG PO TABS
5.0000 mg | ORAL_TABLET | Freq: Every day | ORAL | Status: DC
  Filled 2013-08-24: qty 1

## 2013-08-24 MED ORDER — PHENYLEPHRINE HCL 10 MG/ML IJ SOLN
INTRAMUSCULAR | Status: DC | PRN
  Administered 2013-08-24 (×3): 40 ug via INTRAVENOUS

## 2013-08-24 MED ORDER — BIOTENE DRY MOUTH MT LIQD
15.0000 mL | OROMUCOSAL | Status: DC | PRN
  Administered 2013-08-24: 15 mL via OROMUCOSAL

## 2013-08-24 MED ORDER — SODIUM CHLORIDE 0.9 % IJ SOLN
INTRAMUSCULAR | Status: AC
  Filled 2013-08-24: qty 50

## 2013-08-24 MED ORDER — ONDANSETRON HCL 4 MG/2ML IJ SOLN: 4.0000 mg | Freq: Four times a day (QID) | INTRAMUSCULAR | Status: DC | PRN

## 2013-08-24 MED ORDER — MENTHOL 3 MG MT LOZG: 1.0000 | LOZENGE | OROMUCOSAL | Status: DC | PRN

## 2013-08-24 MED ORDER — PROPOFOL 10 MG/ML IV EMUL
INTRAVENOUS | Status: AC
  Filled 2013-08-24: qty 20

## 2013-08-24 MED ORDER — DORZOLAMIDE HCL 2 % OP SOLN: 1.0000 [drp] | Freq: Two times a day (BID) | OPHTHALMIC | Status: DC

## 2013-08-24 MED ORDER — LIDOCAINE HCL (CARDIAC) 20 MG/ML IV SOLN
INTRAVENOUS | Status: DC | PRN
  Administered 2013-08-24: 50 mg via INTRAVENOUS

## 2013-08-24 MED ORDER — LACTATED RINGERS IV SOLN
INTRAVENOUS | Status: DC
  Administered 2013-08-24 – 2013-08-25 (×3): via INTRAVENOUS

## 2013-08-24 MED ORDER — DORZOLAMIDE HCL-TIMOLOL MAL 2-0.5 % OP SOLN
1.0000 [drp] | Freq: Two times a day (BID) | OPHTHALMIC | Status: DC
  Administered 2013-08-24 – 2013-08-25 (×2): 1 [drp] via OPHTHALMIC

## 2013-08-24 MED ORDER — LISINOPRIL 5 MG PO TABS
5.0000 mg | ORAL_TABLET | Freq: Every day | ORAL | Status: DC
  Filled 2013-08-24: qty 1

## 2013-08-24 MED ORDER — PROPOFOL 10 MG/ML IV BOLUS
INTRAVENOUS | Status: DC | PRN
  Administered 2013-08-24: 120 mg via INTRAVENOUS

## 2013-08-24 MED ORDER — LATANOPROST 0.005 % OP SOLN
1.0000 [drp] | Freq: Every day | OPHTHALMIC | Status: DC
  Administered 2013-08-24: 1 [drp] via OPHTHALMIC

## 2013-08-24 MED ORDER — ROCURONIUM BROMIDE 100 MG/10ML IV SOLN
INTRAVENOUS | Status: DC | PRN
  Administered 2013-08-24: 50 mg via INTRAVENOUS

## 2013-08-24 MED ORDER — LACTATED RINGERS IV SOLN
INTRAVENOUS | Status: DC
  Administered 2013-08-24 (×2): via INTRAVENOUS

## 2013-08-24 MED ORDER — PANTOPRAZOLE SODIUM 40 MG IV SOLR
40.0000 mg | Freq: Every day | INTRAVENOUS | Status: DC
  Administered 2013-08-24: 40 mg via INTRAVENOUS
  Filled 2013-08-24 (×2): qty 40

## 2013-08-24 MED ORDER — DIPHENHYDRAMINE HCL 50 MG/ML IJ SOLN
INTRAMUSCULAR | Status: DC | PRN
  Administered 2013-08-24: 12.5 mg via INTRAVENOUS

## 2013-08-24 MED ORDER — ESTRADIOL 0.1 MG/GM VA CREA
TOPICAL_CREAM | VAGINAL | Status: AC
  Filled 2013-08-24: qty 42.5

## 2013-08-24 MED ORDER — ONDANSETRON HCL 4 MG PO TABS: 4.0000 mg | ORAL_TABLET | Freq: Four times a day (QID) | ORAL | Status: DC | PRN

## 2013-08-24 MED ORDER — GLYCOPYRROLATE 0.2 MG/ML IJ SOLN
INTRAMUSCULAR | Status: DC | PRN
  Administered 2013-08-24: 0.4 mg via INTRAVENOUS
  Administered 2013-08-24 (×2): 0.1 mg via INTRAVENOUS

## 2013-08-24 MED ORDER — EPHEDRINE 5 MG/ML INJ
INTRAVENOUS | Status: AC
  Filled 2013-08-24: qty 10

## 2013-08-24 MED ORDER — LACTATED RINGERS IV SOLN: INTRAVENOUS | Status: DC

## 2013-08-24 MED ORDER — DEXAMETHASONE SODIUM PHOSPHATE 10 MG/ML IJ SOLN
INTRAMUSCULAR | Status: AC
  Filled 2013-08-24: qty 1

## 2013-08-24 MED ORDER — FENTANYL CITRATE 0.05 MG/ML IJ SOLN
INTRAMUSCULAR | Status: AC
  Filled 2013-08-24: qty 5

## 2013-08-24 MED ORDER — ALUM & MAG HYDROXIDE-SIMETH 200-200-20 MG/5ML PO SUSP
30.0000 mL | ORAL | Status: DC | PRN
  Filled 2013-08-24: qty 30

## 2013-08-24 SURGICAL SUPPLY — 45 items
BLADE SURG 10 STRL SS (BLADE) ×3 IMPLANT
BLADE SURG 11 STRL SS (BLADE) ×3 IMPLANT
BLADE SURG 15 STRL LF C SS BP (BLADE) ×2 IMPLANT
BLADE SURG 15 STRL SS (BLADE) ×1
CANISTER SUCT 3000ML (MISCELLANEOUS) ×3 IMPLANT
CHLORAPREP W/TINT 26ML (MISCELLANEOUS) ×6 IMPLANT
CLOTH BEACON ORANGE TIMEOUT ST (SAFETY) ×3 IMPLANT
DECANTER SPIKE VIAL GLASS SM (MISCELLANEOUS) ×3 IMPLANT
DERMABOND ADVANCED (GAUZE/BANDAGES/DRESSINGS) ×1
DERMABOND ADVANCED .7 DNX12 (GAUZE/BANDAGES/DRESSINGS) ×2 IMPLANT
DRAPE LG THREE QUARTER DISP (DRAPES) ×9 IMPLANT
DRSG COVADERM PLUS 2X2 (GAUZE/BANDAGES/DRESSINGS) ×3 IMPLANT
DRSG OPSITE POSTOP 3X4 (GAUZE/BANDAGES/DRESSINGS) ×3 IMPLANT
ELECT LIGASURE SHORT 9 REUSE (ELECTRODE) ×3 IMPLANT
GAUZE PACKING 2X5 YD STRL (GAUZE/BANDAGES/DRESSINGS) IMPLANT
GAUZE PACKING IODOFORM 2 (PACKING) ×3 IMPLANT
GLOVE BIO SURGEON STRL SZ 6.5 (GLOVE) ×12 IMPLANT
GLOVE BIOGEL PI IND STRL 6.5 (GLOVE) ×4 IMPLANT
GLOVE BIOGEL PI INDICATOR 6.5 (GLOVE) ×2
GLOVE INDICATOR 6.5 STRL GRN (GLOVE) ×3 IMPLANT
GOWN STRL REUS W/ TWL LRG LVL3 (GOWN DISPOSABLE) ×14 IMPLANT
GOWN STRL REUS W/TWL LRG LVL3 (GOWN DISPOSABLE) ×19 IMPLANT
NEEDLE INSUFFLATION 120MM (ENDOMECHANICALS) ×3 IMPLANT
NS IRRIG 1000ML POUR BTL (IV SOLUTION) ×3 IMPLANT
PACK LAVH (CUSTOM PROCEDURE TRAY) ×3 IMPLANT
PAD OB MATERNITY 4.3X12.25 (PERSONAL CARE ITEMS) ×3 IMPLANT
PROTECTOR NERVE ULNAR (MISCELLANEOUS) ×6 IMPLANT
SCISSORS LAP 5X35 DISP (ENDOMECHANICALS) ×3 IMPLANT
SET IRRIG TUBING LAPAROSCOPIC (IRRIGATION / IRRIGATOR) IMPLANT
SHEARS HARMONIC ACE PLUS 36CM (ENDOMECHANICALS) ×3 IMPLANT
SHEET LAVH (DRAPES) ×3 IMPLANT
SUT MON AB 4-0 PS1 27 (SUTURE) ×3 IMPLANT
SUT VIC AB 0 CT1 18XCR BRD8 (SUTURE) ×4 IMPLANT
SUT VIC AB 0 CT1 36 (SUTURE) ×6 IMPLANT
SUT VIC AB 0 CT1 8-18 (SUTURE) ×2
SUT VIC AB 2-0 CT1 (SUTURE) ×3 IMPLANT
SUT VIC AB 2-0 SH 27 (SUTURE) ×7
SUT VIC AB 2-0 SH 27XBRD (SUTURE) ×14 IMPLANT
SUT VICRYL 0 TIES 12 18 (SUTURE) ×3 IMPLANT
SUT VICRYL 0 UR6 27IN ABS (SUTURE) IMPLANT
TOWEL OR 17X24 6PK STRL BLUE (TOWEL DISPOSABLE) ×6 IMPLANT
TRAY FOLEY CATH 14FR (SET/KITS/TRAYS/PACK) ×6 IMPLANT
TROCAR XCEL NON-BLD 5MMX100MML (ENDOMECHANICALS) ×9 IMPLANT
WARMER LAPAROSCOPE (MISCELLANEOUS) ×3 IMPLANT
WATER STERILE IRR 1000ML POUR (IV SOLUTION) ×3 IMPLANT

## 2013-08-24 NOTE — Op Note (Signed)
Preoperative Diagnosis: Uterine prolapse, Cystocele, Rectocele, urinary urgency and incontinence Postoperative Diagnosis: same  Procedure: Laparoscopy Assisted Vaginal Hysterectomy, Bilateral salping oophorectomy, anterior and posterior colporrhaphy  Surgeon: Dr. Janyth Pupa  Assistant: Dr. Cyndia Skeeters  Anesthetic: General  IVF: 2100 EBL: 100cc  UOP: 200cc  Specimens: 1) Uterus with bilatearal tubes and ovaries  Findings: No free fluid or omental studding appreciated. Normal appearing liver, gallbladder and bowel. Normal sized anteverted uterus, bilateral tubes and ovaries unremarkable.    Procedure: The patient was taken to the operating room where general anesthesia was found to be adequate. The patient's abdomen was prepped with ChloraPrep. The perineum and vagina were prepped with multiple layers of Betadine. The patient was sterilely draped. A Foley catheter was placed in the bladder. A Hulka tenaculum was placed inside the uterus for manipulation. Gown and gloves were changed and attention was turned to the abdomen.  Infraumbilicus was injected with 10cc of marcaine.  An 32mm vertical incision was made in the infraumbilical area and the Veress needle was inserted into the abdominal cavity without difficulty. Proper placement was confirmed using the saline drop test and opening pressure was 7 mmHg. A pneumoperitoneum was obtained and the laparoscopic trocar and the laparoscope were placed under direct visualization. Two additional ports were placed in the right and left lower quadrants. Each area was injected with Marcaine. A small incision was made and a 5 mm trocar was inserted into the abdominal cavity under direct visualization. An abdominal scan was performed with the findings noted above.   Attention was turned to the left adnexa and the ureter was idenitifed. The infundibulopelvic ligament was clamped, elevated, ligated and divided using the Novasure. Serial ligation was performed up to  the proximal portion of the utero-ovarian ligament and cornua of the fallopian tube. The left round ligament was then grasped, elevated, fulgurated and divided using the Novasure. The anterior leaf of the broad ligament was incised inferiorly and superiorly and the retroperitoneal space was developed. The anterior leaf of the broad ligament was dissected medially and inferiorly to allow dissection of the vesicouterine flap. Attention was turned to the right adnexa, the right ureter was identified.  In a similar fashion, the right infundibulopelvic ligament was clamped, elevated, ligated and divided using the Novasure and serial ligation was performed to remove the tube and ovary. The right round ligament was clamped, elevated, ligated, and divided with the Novasure. The vesicouterine fold of peritoneum was incised anteriorly for full dissection of the vesicouterine flap. Hemostasis was noted. The case then proceeded to the vaginal portion.  The Hulka was removed and a weighted speculum was placed in the posterior vagina. The cervix was injected with half percent Marcaine with epinephrine. The cerix was then circumferentially incised with the bovie and the bladder was dissected off the pubovesical cervical fascia. The anterior cul-de-sac was entered sharply. The same procedure was performed posteriorly and the posterior cu-lde-sac was entered sharply without difficulty. A heany clamp was placed over the uterosacral ligaments bilaterally. These were transected and suture ligated with 0 vicryl. The cardinal ligaments were then clamped bilaterally and transected and suture ligated in a similar fashion. The uterine arteries and broad ligament was then serially clamped with heany clamps, transected and suture ligated bilaterally. The uterus, bilateral fallopian tube and ovaries were removed from the operative field and sent to pathology. Hemostasis was confirmed.  Attention was first turned to the posterior compartment.   The vaginal mucosa were injected with 1% lidocaine with epinephrine. The mucosa  was incised in a transverse fashion with the scalpel. A midline vaginal incision was made with Metzenbaum scissors. The posterior vaginal mucosa was incised from the anterior rectal mucosa via sharp dissection with Metzenbaum scissors.  In the rectocele defect was dissected from the posterior vaginal mucosa with a series of sharp and blunt dissection. The rectovaginal fascia was identified and reapproximated with interrupted sutures of 2-0 Vicryl. The rectocele was adequately reduced. A portion of the posterior vaginal mucosa was excised with Mayo scissors. The vaginal mucosa was then reapproximated with 0 Vicryl in a running locked fashion.   Attention was turned to the anterior compartment. The anterior vaginal mucosa was injected with 1% lidocaine with epinephrine and incised in a midline fashion using the Metzenbaum scissors. The anterior vaginal mucosa was incised from the bladder via sharp and blunt dissection. The cervico-vaginal fascia was identified and reapproximated with interrupted sutures of 2-0 Vicryl. The cystocele was adequately reduced. The redundant anterior vaginal mucosa was excised with Mayo scissors. The sutures attached to the uterosacral ligaments were brought out through the vaginal angles and then tied securely together. A final check was made for hemostasis and confirmed. The vaginal mucosa including the vaginal cuff was then reapproximated with 0 Vicryl in a running locked fashion. Vaginal packing was placed. Gown and gloves were changed and attention was turned to the abdomen.   The pneumoperitoneum was reestablished. The pelvis was irrigated and inspected, excellent hemostasis was noted.  Both ureters were visualized with peristalsis.  The lateral 5 mm trocars were removed under direct visualization. The pneumoperitoneum was allowed to escape and the infraumbilical trocar was removed. Dermabond was  placed over the three trocar sites. The patient tolerated the procedure well. She was awakened from her anesthetic without difficulty and transported to the recovery room in stable condition. Sponge, needle, and instrument counts were correct.   Janyth Pupa, DO  [phone] (pager)  [phone] (office)

## 2013-08-24 NOTE — Progress Notes (Signed)
Patient's sats dropped to 50's.  Switched from nasal cannula to simple mask. Dr. Dawayne Cirri notified.  After drop to 70's Dr. Dawayne Cirri asked to have patient taken to [facility] for further monitoring.    Ranell Patrick, RN

## 2013-08-24 NOTE — Anesthesia Procedure Notes (Signed)
Procedure Name: Intubation Date/Time: 08/24/2013 10:44 AM Performed by: Jonna Munro Pre-anesthesia Checklist: Patient being monitored, Suction available, Emergency Drugs available, Patient identified and Timeout performed Patient Re-evaluated:Patient Re-evaluated prior to inductionOxygen Delivery Method: Circle system utilized Preoxygenation: Pre-oxygenation with 100% oxygen Intubation Type: IV induction Ventilation: Mask ventilation without difficulty Laryngoscope Size: Mac, 3, Miller and 2 Grade View: Grade IV Tube type: Oral Tube size: 7.0 mm Number of attempts: 3 Airway Equipment and Method: Stylet and Video-laryngoscopy Placement Confirmation: ETT inserted through vocal cords under direct vision,  positive ETCO2 and breath sounds checked- equal and bilateral Secured at: 22 cm Tube secured with: Tape Dental Injury: Teeth and Oropharynx as per pre-operative assessment  Difficulty Due To: Difficult Airway- due to reduced neck mobility and Difficult Airway- due to immobile epiglottis Comments: DLx1 with Mac 3 blade attempted to intubate, tube inserted in esophagus, removed tube, mask ventilated easily. DL with Sabra Heck 2 by Dr. Jillyn Hidden, tube in espohagus, removed and mask ventilated easily. DL with glide scope, cords visualized, intubated with +ETCO2, BBS, chest rise present. Atraumatic intubation, no damage to tissue or teeth noted.

## 2013-08-24 NOTE — Transfer of Care (Signed)
Immediate Anesthesia Transfer of Care Note  Patient: Rachel Henry  Procedure(s) Performed: Procedure(s): LAPAROSCOPIC ASSISTED VAGINAL HYSTERECTOMY (N/A) ANTERIOR (CYSTOCELE) AND POSTERIOR REPAIR (RECTOCELE) (N/A) SALPINGO OOPHORECTOMY (N/A)  Patient Location: PACU  Anesthesia Type:General  Level of Consciousness: awake, alert  and oriented  Airway & Oxygen Therapy: Patient Spontanous Breathing and Patient connected to nasal cannula oxygen  Post-op Assessment: Report given to PACU RN and Post -op Vital signs reviewed and stable  Post vital signs: Reviewed and stable  Complications: No apparent anesthesia complications

## 2013-08-24 NOTE — Anesthesia Preprocedure Evaluation (Signed)
Anesthesia Evaluation  Patient identified by MRN, date of birth, ID band Patient awake    Reviewed: Allergy & Precautions, H&P , NPO status , Patient's Chart, lab work & pertinent test results, reviewed documented beta blocker date and time   Airway Mallampati: II TM Distance: >3 FB Neck ROM: full    Dental no notable dental hx. (+) Teeth Intact   Pulmonary former smoker,          Cardiovascular hypertension, negative cardio ROS      Neuro/Psych negative psych ROS   GI/Hepatic Neg liver ROS,   Endo/Other  diabetes, Type 2, Oral Hypoglycemic Agents  Renal/GU negative Renal ROS     Musculoskeletal   Abdominal Normal abdominal exam  (+)   Peds  Hematology negative hematology ROS (+)   Anesthesia Other Findings   Reproductive/Obstetrics negative OB ROS                           Anesthesia Physical Anesthesia Plan  ASA: II  Anesthesia Plan: General   Post-op Pain Management:    Induction: Intravenous  Airway Management Planned: Oral ETT  Additional Equipment:   Intra-op Plan:   Post-operative Plan: Extubation in OR  Informed Consent: I have reviewed the patients History and Physical, chart, labs and discussed the procedure including the risks, benefits and alternatives for the proposed anesthesia with the patient or authorized representative who has indicated his/her understanding and acceptance.   Dental Advisory Given  Plan Discussed with: CRNA and Surgeon  Anesthesia Plan Comments:         Anesthesia Quick Evaluation

## 2013-08-24 NOTE — Anesthesia Postprocedure Evaluation (Signed)
Anesthesia Post Note  Patient: Rachel Henry  Procedure(s) Performed: Procedure(s) (LRB): LAPAROSCOPIC ASSISTED VAGINAL HYSTERECTOMY (N/A) ANTERIOR (CYSTOCELE) AND POSTERIOR REPAIR (RECTOCELE) (N/A) SALPINGO OOPHORECTOMY (N/A)  Anesthesia type: GA  Patient location: PACU  Post pain: Pain level controlled  Post assessment: Post-op Vital signs reviewed  Last Vitals:  Filed Vitals:   08/24/13 1415  BP: 141/78  Pulse: 68  Temp: 36.5 C  Resp: 13    Post vital signs: Reviewed  Level of consciousness: sedated  Complications: No apparent anesthesia complications

## 2013-08-24 NOTE — Interval H&P Note (Signed)
History and Physical Interval Note:  08/24/2013 10:16 AM  Rachel Henry  has presented today for surgery, with the diagnosis of Cystocele/Rectocele/Prolapse  The various methods of treatment have been discussed with the patient and family. After consideration of risks, benefits and other options for treatment, the patient has consented to  Procedure(s) with comments: [place] (N/A) - Bilateral Salpingo-oophorectomy ANTERIOR (CYSTOCELE) AND POSTERIOR REPAIR (RECTOCELE) (N/A) as a surgical intervention .  The patient's history has been reviewed, patient examined, no change in status, stable for surgery.  I have reviewed the patient's chart and labs.  Questions were answered to the patient's satisfaction.     Janyth Pupa, M

## 2013-08-25 ENCOUNTER — Encounter (HOSPITAL_COMMUNITY): Payer: Self-pay | Admitting: Obstetrics & Gynecology

## 2013-08-25 DIAGNOSIS — K222 Esophageal obstruction: Secondary | ICD-10-CM | POA: Diagnosis present

## 2013-08-25 DIAGNOSIS — K208 Other esophagitis without bleeding: Secondary | ICD-10-CM | POA: Diagnosis present

## 2013-08-25 DIAGNOSIS — K5730 Diverticulosis of large intestine without perforation or abscess without bleeding: Secondary | ICD-10-CM | POA: Diagnosis present

## 2013-08-25 DIAGNOSIS — N812 Incomplete uterovaginal prolapse: Secondary | ICD-10-CM | POA: Diagnosis present

## 2013-08-25 DIAGNOSIS — I8390 Asymptomatic varicose veins of unspecified lower extremity: Secondary | ICD-10-CM | POA: Diagnosis present

## 2013-08-25 DIAGNOSIS — Z85828 Personal history of other malignant neoplasm of skin: Secondary | ICD-10-CM | POA: Diagnosis not present

## 2013-08-25 DIAGNOSIS — E119 Type 2 diabetes mellitus without complications: Secondary | ICD-10-CM | POA: Diagnosis present

## 2013-08-25 DIAGNOSIS — Z833 Family history of diabetes mellitus: Secondary | ICD-10-CM | POA: Diagnosis not present

## 2013-08-25 DIAGNOSIS — H269 Unspecified cataract: Secondary | ICD-10-CM | POA: Diagnosis present

## 2013-08-25 DIAGNOSIS — K449 Diaphragmatic hernia without obstruction or gangrene: Secondary | ICD-10-CM | POA: Diagnosis present

## 2013-08-25 DIAGNOSIS — N393 Stress incontinence (female) (male): Secondary | ICD-10-CM | POA: Diagnosis present

## 2013-08-25 DIAGNOSIS — E785 Hyperlipidemia, unspecified: Secondary | ICD-10-CM | POA: Diagnosis present

## 2013-08-25 DIAGNOSIS — N952 Postmenopausal atrophic vaginitis: Secondary | ICD-10-CM | POA: Diagnosis present

## 2013-08-25 DIAGNOSIS — K5190 Ulcerative colitis, unspecified, without complications: Secondary | ICD-10-CM | POA: Diagnosis present

## 2013-08-25 DIAGNOSIS — Z8249 Family history of ischemic heart disease and other diseases of the circulatory system: Secondary | ICD-10-CM | POA: Diagnosis not present

## 2013-08-25 DIAGNOSIS — N840 Polyp of corpus uteri: Secondary | ICD-10-CM | POA: Diagnosis present

## 2013-08-25 DIAGNOSIS — N814 Uterovaginal prolapse, unspecified: Secondary | ICD-10-CM | POA: Diagnosis present

## 2013-08-25 DIAGNOSIS — D251 Intramural leiomyoma of uterus: Secondary | ICD-10-CM | POA: Diagnosis present

## 2013-08-25 DIAGNOSIS — D279 Benign neoplasm of unspecified ovary: Secondary | ICD-10-CM | POA: Diagnosis present

## 2013-08-25 DIAGNOSIS — Z86010 Personal history of colonic polyps: Secondary | ICD-10-CM | POA: Diagnosis not present

## 2013-08-25 DIAGNOSIS — K649 Unspecified hemorrhoids: Secondary | ICD-10-CM | POA: Diagnosis present

## 2013-08-25 DIAGNOSIS — I10 Essential (primary) hypertension: Secondary | ICD-10-CM | POA: Diagnosis present

## 2013-08-25 DIAGNOSIS — Z87891 Personal history of nicotine dependence: Secondary | ICD-10-CM | POA: Diagnosis not present

## 2013-08-25 LAB — CBC
HCT: 32.5 % — ABNORMAL LOW (ref 36.0–46.0)
Hemoglobin: 11 g/dL — ABNORMAL LOW (ref 12.0–15.0)
MCH: 31.4 pg (ref 26.0–34.0)
MCHC: 33.8 g/dL (ref 30.0–36.0)
MCV: 92.9 fL (ref 78.0–100.0)
PLATELETS: 188 10*3/uL (ref 150–400)
RBC: 3.5 MIL/uL — AB (ref 3.87–5.11)
RDW: 13.5 % (ref 11.5–15.5)
WBC: 8.8 10*3/uL (ref 4.0–10.5)

## 2013-08-25 LAB — BASIC METABOLIC PANEL
ANION GAP: 8 (ref 5–15)
BUN: 14 mg/dL (ref 6–23)
CALCIUM: 8 mg/dL — AB (ref 8.4–10.5)
CO2: 29 mEq/L (ref 19–32)
CREATININE: 0.7 mg/dL (ref 0.50–1.10)
Chloride: 105 mEq/L (ref 96–112)
GFR, EST NON AFRICAN AMERICAN: 81 mL/min — AB (ref >90)
Glucose, Bld: 104 mg/dL — ABNORMAL HIGH (ref 70–99)
Potassium: 3.4 mEq/L — ABNORMAL LOW (ref 3.7–5.3)
SODIUM: 142 meq/L (ref 137–147)

## 2013-08-25 LAB — GLUCOSE, CAPILLARY: Glucose-Capillary: 137 mg/dL — ABNORMAL HIGH (ref 70–99)

## 2013-08-25 MED ORDER — KETOROLAC TROMETHAMINE 15 MG/ML IJ SOLN
15.0000 mg | Freq: Four times a day (QID) | INTRAMUSCULAR | Status: DC
  Filled 2013-08-25 (×3): qty 1

## 2013-08-25 MED ORDER — IBUPROFEN 600 MG PO TABS: 600.0000 mg | ORAL_TABLET | Freq: Four times a day (QID) | ORAL | Status: AC | PRN

## 2013-08-25 MED ORDER — SULFASALAZINE 500 MG PO TABS
500.0000 mg | ORAL_TABLET | Freq: Two times a day (BID) | ORAL | Status: DC
Start: 2013-08-25 — End: 2013-08-25
  Administered 2013-08-25: 500 mg via ORAL
  Filled 2013-08-25 (×3): qty 1

## 2013-08-25 MED ORDER — LISINOPRIL 5 MG PO TABS
5.0000 mg | ORAL_TABLET | Freq: Every day | ORAL | Status: DC
  Filled 2013-08-25: qty 1

## 2013-08-25 MED ORDER — IBUPROFEN 600 MG PO TABS: 600.0000 mg | ORAL_TABLET | Freq: Four times a day (QID) | ORAL | Status: DC | PRN

## 2013-08-25 MED ORDER — KETOROLAC TROMETHAMINE 15 MG/ML IJ SOLN
15.0000 mg | Freq: Four times a day (QID) | INTRAMUSCULAR | Status: DC
  Administered 2013-08-25 (×2): 15 mg via INTRAVENOUS
  Filled 2013-08-25 (×3): qty 1

## 2013-08-25 MED ORDER — DSS 100 MG PO CAPS: 100.0000 mg | ORAL_CAPSULE | Freq: Two times a day (BID) | ORAL | Status: DC

## 2013-08-25 MED ORDER — OXYCODONE-ACETAMINOPHEN 5-325 MG PO TABS: 1.0000 | ORAL_TABLET | Freq: Four times a day (QID) | ORAL | Status: DC | PRN

## 2013-08-25 MED ORDER — HYDROCHLOROTHIAZIDE 12.5 MG PO CAPS
12.5000 mg | ORAL_CAPSULE | Freq: Every day | ORAL | Status: DC
  Filled 2013-08-25 (×2): qty 1

## 2013-08-25 MED ORDER — METFORMIN HCL ER 500 MG PO TB24
500.0000 mg | ORAL_TABLET | Freq: Every day | ORAL | Status: DC
  Filled 2013-08-25: qty 1

## 2013-08-25 NOTE — Anesthesia Postprocedure Evaluation (Signed)
  Anesthesia Post-op Note  Patient: Rachel Henry  Procedure(s) Performed: Procedure(s): LAPAROSCOPIC ASSISTED VAGINAL HYSTERECTOMY (N/A) ANTERIOR (CYSTOCELE) AND POSTERIOR REPAIR (RECTOCELE) (N/A) SALPINGO OOPHORECTOMY (N/A)  Patient Location: A-ICU  Anesthesia Type:General  Level of Consciousness: awake, alert  and oriented  Airway and Oxygen Therapy: Patient Spontanous Breathing and Patient connected to nasal cannula oxygen  Post-op Pain: none  Post-op Assessment: Post-op Vital signs reviewed, Patient's Cardiovascular Status Stable, Respiratory Function Stable and Pain level controlled  Post-op Vital Signs: Reviewed and stable  Last Vitals:  Filed Vitals:   08/25/13 0605  BP: 145/55  Pulse: 63  Temp:   Resp: 16    Complications: No apparent anesthesia complications

## 2013-08-25 NOTE — Discharge Summary (Signed)
Physician Discharge Summary  Patient ID: Rachel Henry MRN: [id number] DOB/AGE: 1933-01-07 80 y.o.  Admit date: 08/24/2013 Discharge date: 08/25/2013  Admission Diagnoses:  Uterine prolapse, cystocele, rectocele, urinary urgency, vaginal atrophy  Discharge Diagnoses: same   Discharged Condition: stable  Hospital Course: 80yo postmenopausal who was admitted for surgical management of her pelvic organ prolapse.  Due to prior abdominal surgery and concern for adhesions, the patient underwent an LAVH, BSO, anterior and posterior colporrhaphy.  For information regarding the procedure, please see the operative report.  Due to drop in O2 saturation postoperatively when sleeping pt was transferred to the ICU for further monitoring.  With incentive spirometry and increased activity O2 sats were noted to be stable >90% off oxygen.  She was transferred to the floor where routine postoperative care was continued.  Postoperatively, her pain was well controlled, she was able to void freely s/p foley removal, she tolerated a general diet without difficulty and her vaginal bleeding was minimal.  She was discharged home in stable condition on POD #1.  Consults: None  Significant Diagnostic Studies: labs:  CBC    Component Value Date/Time   WBC 8.8 08/25/2013 0523   RBC 3.50* 08/25/2013 0523   HGB 11.0* 08/25/2013 0523   HCT 32.5* 08/25/2013 0523   PLT 188 08/25/2013 0523   MCV 92.9 08/25/2013 0523   MCH 31.4 08/25/2013 0523   MCHC 33.8 08/25/2013 0523   RDW 13.5 08/25/2013 0523   LYMPHSABS 1.5 05/03/2009 1128   MONOABS 0.6 05/03/2009 1128   EOSABS 0.2 05/03/2009 1128   BASOSABS 0.0 05/03/2009 1128    Treatments: IV hydration, antibiotics: Cefotetan, analgesia: Toradol, Percocet, and surgery: LAVH, BSO, anterior and posterior colporrhaphy  Discharge Exam: Blood pressure 130/58, pulse 70, temperature 99.3 F (37.4 C), temperature source Oral, resp. rate 18, height 5\' 2"  (1.575 m), weight 74.39 kg (164 lb), SpO2  94.00%. Gen: A&Ox3, NAD  CV: RRR, no MRG  Resp: CTAB  Abdomen: soft, NT, ND +BS  GU: pad dry, packing removed this am with minimal blood noted per RN  Ext: No edema, no calf tenderness bilaterally, SCDs in place  Disposition: 01-Home or Self Care     Medication List         amLODipine 5 MG tablet  Commonly known as:  NORVASC  Take 5 mg by mouth daily.     CALCIUM PO  Take 400 mg by mouth daily.     Co Q 10 100 MG Caps  Take 200 mg by mouth daily.     conjugated estrogens vaginal cream  Commonly known as:  PREMARIN  Place 1 Applicatorful vaginally 2 (two) times a week.     dorzolamide 2 % ophthalmic solution  Commonly known as:  TRUSOPT  Place 1 drop into the right eye 2 (two) times daily.     DSS 100 MG Caps  Take 100 mg by mouth 2 (two) times daily.     FIBER PO  Take 1 capsule by mouth daily.     Fish Oil 1000 MG Caps  Take 1 capsule by mouth daily.     hydrochlorothiazide 12.5 MG capsule  Commonly known as:  MICROZIDE  Take 12.5 mg by mouth daily.     hydroxypropyl methylcellulose 2.5 % ophthalmic solution  Commonly known as:  ISOPTO TEARS  Place 1 drop into both eyes daily as needed for dry eyes.     ibuprofen 600 MG tablet  Commonly known as:  ADVIL,MOTRIN  Take 1 tablet (  600 mg total) by mouth every 6 (six) hours as needed for mild pain or moderate pain.     latanoprost 0.005 % ophthalmic solution  Commonly known as:  XALATAN  Place 1 drop into the right eye at bedtime.     lisinopril 5 MG tablet  Commonly known as:  PRINIVIL,ZESTRIL  Take 5 mg by mouth daily.     LUTEIN PO  Take 25 mg by mouth daily.     metFORMIN 500 MG 24 hr tablet  Commonly known as:  GLUCOPHAGE-XR  Take 500 mg by mouth daily with breakfast.     multivitamin tablet  Take 1 tablet by mouth daily.     niacin 500 MG tablet  Take 500 mg by mouth at bedtime.     oxyCODONE-acetaminophen 5-325 MG per tablet  Commonly known as:  PERCOCET/ROXICET  Take 1 tablet by mouth  every 6 (six) hours as needed for severe pain (moderate to severe pain (when tolerating fluids)).     Potassium 95 MG Tabs  Take 1 tablet by mouth daily.     ranitidine 150 MG tablet  Commonly known as:  ZANTAC  Take 150 mg by mouth daily as needed for heartburn.     RED YEAST RICE PO  Take 1,200 mg by mouth daily.     sulfaSALAzine 500 MG tablet  Commonly known as:  AZULFIDINE  TAKE TWO TABLETS BY MOUTH TWICE DAILY     vitamin C 1000 MG tablet  Take 1,000 mg by mouth daily.     VITAMIN D-3 PO  Take 1 tablet by mouth daily.     zinc gluconate 50 MG tablet  Take 50 mg by mouth 2 (two) times a week.           Follow-up Information   Follow up with Janyth Pupa, M, DO In 2 weeks.   Specialty:  Obstetrics and Gynecology   Contact information:   [address] [place] [id number] [phone]       Signed: Annalee Genta 08/25/2013, 10:00 PM

## 2013-08-25 NOTE — Addendum Note (Signed)
Addendum created 08/25/13 0813 by Jonna Munro, CRNA   Modules edited: Notes Section   Notes Section:  File: [id number]

## 2013-08-25 NOTE — Progress Notes (Signed)
UR chart review completed.  

## 2013-08-25 NOTE — Progress Notes (Signed)
Pt  Out in wheelchair   Teaching complete  

## 2013-08-25 NOTE — Progress Notes (Signed)
Postoperative Note Day # 1  S:  Patient resting comfortable in bed.  Pain controlled.  Tolerating general. No flatus, no BM.  Ambulating without difficulty.  She denies n/v/f/c, SOB, or CP.  Vaginal packing and foley in place overnight.  O: Temp:  [97.5 F (36.4 C)-98.7 F (37.1 C)] 98.7 F (37.1 C) (08/06 0342) Pulse Rate:  [57-73] 63 (08/06 0605) Resp:  [10-18] 16 (08/06 0605) BP: (125-186)/(51-78) 145/55 mmHg (08/06 0605) SpO2:  [91 %-100 %] 97 % (08/06 0605) Weight:  [74.39 kg (164 lb)] 74.39 kg (164 lb) (08/05 2110) UOP: 1675/8hr  Gen: A&Ox3, NAD CV: RRR, no MRG Resp: CTAB Abdomen: soft, NT, ND +BS GU: pad dry, packing removed this am with minimal blood noted per RN Ext: No edema, no calf tenderness bilaterally, SCDs in place  Labs:  CBC    Component Value Date/Time   WBC 8.8 08/25/2013 0523   RBC 3.50* 08/25/2013 0523   HGB 11.0* 08/25/2013 0523   HCT 32.5* 08/25/2013 0523   PLT 188 08/25/2013 0523   MCV 92.9 08/25/2013 0523   MCH 31.4 08/25/2013 0523   MCHC 33.8 08/25/2013 0523   RDW 13.5 08/25/2013 0523   LYMPHSABS 1.5 05/03/2009 1128   MONOABS 0.6 05/03/2009 1128   EOSABS 0.2 05/03/2009 1128   BASOSABS 0.0 05/03/2009 1128   BUN/CR: 14/0.7  Glucose: 164, 149, 138, 104 (this am)  A/P: Pt is a 80 y.o. s/p LAVH, BSO, anterior/posterior colporrhaphy POD #1  - Pain well controlled, will transition to oral medication only today -GU: UOP adequate, foley removed this am.  Pt to void on her own before discharge  -GI: Tolerating general diet, not yet passing flatus.  H/o Ulcerative colitis- Will restart home sulfasalazine -Activity: encouraged sitting up to chair and ambulation as tolerated -Prophylaxis: encourage ambulation, SCDs in place -Labs: stable as above -Endo: Type II DM: pt being covered with sliding scale, accuchecks as above, will restart metformin this am -Resp: O2 remains stable with nasal cannula, suspect undiagnosed sleep apnea as her saturations only decreases when  sleeping at night otherwise stable on room air during the day, will plan to discontinue supplement O2 this am. -Cardio: HTN- BP stable this am.  Will give Lisinopril and HCTZ this am, will hold amlodipine this am.  DISPO:  If patient is able to void freely and pass gas will plan for discharge home today, for now continue with postoperative management as outlined above.  Janyth Pupa, DO [phone] (pager) [phone] (office)

## 2013-08-25 NOTE — Discharge Instructions (Signed)
Laparoscopically Assisted Vaginal Hysterectomy, Anterior and Posterior colporrhaphy, Care After Refer to this sheet in the next few weeks. These instructions provide you with information on caring for yourself after your procedure. Your health care provider may also give you more specific instructions. Your treatment has been planned according to current medical practices, but problems sometimes occur. Call your health care provider if you have any problems or questions after your procedure. WHAT TO EXPECT AFTER THE PROCEDURE After your procedure, it is typical to have the following:  Abdominal pain. You will be given pain medicine to control it.  Sore throat from the breathing tube that was inserted during surgery. HOME CARE INSTRUCTIONS  Only take over-the-counter or prescription medicines for pain, discomfort, or fever as directed by your health care provider.  You may alternate between the Percocet and Motrin for pain- use percocet for moderate pain only.  Percocet may cause constipation, please use the stool softener to avoid straining and constipation.  Do not take aspirin. It can cause bleeding.  Do not drive when taking pain medicine.  Follow your health care provider's advice regarding diet, exercise, lifting, driving, and general activities.  Resume your usual diet as directed and allowed.  Get plenty of rest and sleep.  Do not douche, use tampons, or have sexual intercourse for at least 6 weeks, or until your health care provider gives you permission.  Change your bandages (dressings) as directed by your health care provider.  Monitor your temperature and notify your health care provider of a fever.  Take showers instead of baths for 2-3 weeks.  Do not drink alcohol until your health care provider gives you permission.  If you develop constipation, you may take a mild laxative with your health care provider's permission. Bran foods may help with constipation problems.  Drinking enough fluids to keep your urine clear or pale yellow may help as well.  Try to have someone home with you for 1-2 weeks to help around the house.  Avoid strenuous activity such as heavy lifting (more than 10 pounds [4.5 kg]), pushing, and pulling until your health care provider says it is okay.  Keep all of your follow-up appointments as directed by your health care provider. SEEK MEDICAL CARE IF:   You have swelling, redness, or increasing pain around your incision sites.  You have pus coming from your incision.  You notice a bad smell coming from your incision.  Your incision breaks open.  You feel dizzy or lightheaded.  You have pain or bleeding when you urinate.  You have persistent diarrhea.  You have persistent nausea and vomiting.  You have abnormal vaginal discharge.  You have a rash.  You have any type of abnormal reaction or develop an allergy to your medicine.  You have poor pain control with your prescribed medicine. SEEK IMMEDIATE MEDICAL CARE IF:   You have a fever.  You have severe abdominal pain.  You have chest pain.  You have shortness of breath.  You faint.  You have pain, swelling, or redness in your leg.  You have heavy vaginal bleeding with blood clots. MAKE SURE YOU:  Understand these instructions.  Will watch your condition.  Will get help right away if you are not doing well or get worse. Document Released: 12/26/2010 Document Revised: 01/11/2013 Document Reviewed: 07/22/2012 [facility] Patient Information 2015 [place], [place]. This information is not intended to replace advice given to you by your health care provider. Make sure you discuss any questions you have  with your health care provider.

## 2013-08-25 NOTE — Progress Notes (Addendum)
Notified Dr. Nelda Marseille of am BP (118/57). Will plan to hold am BP meds and reassess this afternoon.

## 2013-09-02 ENCOUNTER — Encounter: Payer: Self-pay | Admitting: *Deleted

## 2013-10-21 ENCOUNTER — Other Ambulatory Visit: Payer: Self-pay | Admitting: Internal Medicine

## 2014-02-25 ENCOUNTER — Other Ambulatory Visit: Payer: Self-pay | Admitting: Internal Medicine

## 2014-05-16 ENCOUNTER — Encounter: Payer: Self-pay | Admitting: Internal Medicine

## 2014-07-13 ENCOUNTER — Other Ambulatory Visit: Payer: Self-pay | Admitting: Internal Medicine

## 2014-11-01 ENCOUNTER — Ambulatory Visit (INDEPENDENT_AMBULATORY_CARE_PROVIDER_SITE_OTHER): Payer: Medicare Other | Admitting: Podiatry

## 2014-11-01 ENCOUNTER — Encounter: Payer: Self-pay | Admitting: Podiatry

## 2014-11-01 DIAGNOSIS — Q828 Other specified congenital malformations of skin: Secondary | ICD-10-CM | POA: Diagnosis not present

## 2014-11-01 NOTE — Patient Instructions (Signed)
Today you diabetic exam had adequate pulsations in your feet Slightly decreased feeling to the tuning fork A hard nucleated corn on the ball area of the right foot with a small bleeding area within the callus Today I trim back the callus and apply topical antibiotic ointment Continue to apply adhesive foam pad (nonmedicated) with a small amount of topical antibiotic ointment to area feels comfortable Return as needed for further debridement, or trimming of this corn-like growth    Diabetes and Foot Care Diabetes may cause you to have problems because of poor blood supply (circulation) to your feet and legs. This may cause the skin on your feet to become thinner, break easier, and heal more slowly. Your skin may become dry, and the skin may peel and crack. You may also have nerve damage in your legs and feet causing decreased feeling in them. You may not notice minor injuries to your feet that could lead to infections or more serious problems. Taking care of your feet is one of the most important things you can do for yourself.  HOME CARE INSTRUCTIONS  Wear shoes at all times, even in the house. Do not go barefoot. Bare feet are easily injured.  Check your feet daily for blisters, cuts, and redness. If you cannot see the bottom of your feet, use a mirror or ask someone for help.  Wash your feet with warm water (do not use hot water) and mild soap. Then pat your feet and the areas between your toes until they are completely dry. Do not soak your feet as this can dry your skin.  Apply a moisturizing lotion or petroleum jelly (that does not contain alcohol and is unscented) to the skin on your feet and to dry, brittle toenails. Do not apply lotion between your toes.  Trim your toenails straight across. Do not dig under them or around the cuticle. File the edges of your nails with an emery board or nail file.  Do not cut corns or calluses or try to remove them with medicine.  Wear clean socks or  stockings every day. Make sure they are not too tight. Do not wear knee-high stockings since they may decrease blood flow to your legs.  Wear shoes that fit properly and have enough cushioning. To break in new shoes, wear them for just a few hours a day. This prevents you from injuring your feet. Always look in your shoes before you put them on to be sure there are no objects inside.  Do not cross your legs. This may decrease the blood flow to your feet.  If you find a minor scrape, cut, or break in the skin on your feet, keep it and the skin around it clean and dry. These areas may be cleansed with mild soap and water. Do not cleanse the area with peroxide, alcohol, or iodine.  When you remove an adhesive bandage, be sure not to damage the skin around it.  If you have a wound, look at it several times a day to make sure it is healing.  Do not use heating pads or hot water bottles. They may burn your skin. If you have lost feeling in your feet or legs, you may not know it is happening until it is too late.  Make sure your health care provider performs a complete foot exam at least annually or more often if you have foot problems. Report any cuts, sores, or bruises to your health care provider immediately. [place]  CARE IF:   You have an injury that is not healing.  You have cuts or breaks in the skin.  You have an ingrown nail.  You notice redness on your legs or feet.  You feel burning or tingling in your legs or feet.  You have pain or cramps in your legs and feet.  Your legs or feet are numb.  Your feet always feel cold. SEEK IMMEDIATE MEDICAL CARE IF:   There is increasing redness, swelling, or pain in or around a wound.  There is a red line that goes up your leg.  Pus is coming from a wound.  You develop a fever or as directed by your health care provider.  You notice a bad smell coming from an ulcer or wound.   This information is not intended to replace advice  given to you by your health care provider. Make sure you discuss any questions you have with your health care provider.   Document Released: 01/04/2000 Document Revised: 09/08/2012 Document Reviewed: 06/15/2012 Elsevier Interactive Patient Education [employer].

## 2014-11-01 NOTE — Progress Notes (Signed)
   Subjective:    Patient ID: Rachel Henry, female    DOB: 1932-03-24, 81 y.o.   MRN: [id number]  HPI   This patient presents today complaining of a painful skin lesion on the plantar aspect of the right foot for approximately one year gradually worsening over time. There is uncomfortable with weightbearing and relieved with rest. Patient is attempted to apply over-the-counter pads with some slight reduction of discomfort when wearing the pads.  Patient says that she is prediabetic and denies any history of foot ulceration, claudication or amputation  Review of Systems  HENT: Positive for sinus pressure.   Musculoskeletal: Positive for gait problem.  All other systems reviewed and are negative.      Objective:   Physical Exam  Orientated 3  Vascular: No peripheral edema noted bilaterally DP pulses 2/4 bilaterally PT pulses 1/4 bilaterally Capillary reflex immediate bilaterally  Neurological: Sensation to 10 g monofilament wire intact 5/5 bilaterally Vibratory sensation reactive right nonreactive left Ankle reflexes equal and reactive bilaterally  Dermatological: No open skin lesions noted bilaterally Nucleated plantar keratoses with some bleeding within the keratoses on the plantar first MPJ, right foot  Musculoskeletal: Pes planus bilaterally There is no restriction ankle, subtalar or midtarsal joints bilaterally       Assessment & Plan:   Assessment: Satisfactory vascular status with mildly decreased posterior tibial pulses Decreased sensation to tuning fork suggestive mild peripheral neuropathy  porokeratosis plantar aspect the right foot without clinical sign infection or ulceration  Plan: Today I review the results of the examination with patient today. I debrided the plantar keratoses on the right foot without any bleeding. Commended the patient apply topical antibiotic ointment and nonmedicated pad around this area until the area was comfortable. I  recommended she return as needed for repetitive debridement of this lesion and that it would reoccur. After visit summary provided for generalized diabetic foot care

## 2014-12-01 ENCOUNTER — Other Ambulatory Visit: Payer: Self-pay | Admitting: Internal Medicine

## 2015-03-27 ENCOUNTER — Encounter: Payer: Self-pay | Admitting: Internal Medicine

## 2015-04-09 ENCOUNTER — Other Ambulatory Visit: Payer: Self-pay | Admitting: Internal Medicine

## 2015-05-08 ENCOUNTER — Other Ambulatory Visit: Payer: Self-pay | Admitting: Internal Medicine

## 2015-09-04 ENCOUNTER — Other Ambulatory Visit: Payer: Self-pay | Admitting: Internal Medicine

## 2015-10-16 ENCOUNTER — Other Ambulatory Visit: Payer: Self-pay

## 2015-10-16 DIAGNOSIS — C4491 Basal cell carcinoma of skin, unspecified: Secondary | ICD-10-CM

## 2015-10-16 HISTORY — DX: Basal cell carcinoma of skin, unspecified: C44.91

## 2015-11-13 ENCOUNTER — Other Ambulatory Visit: Payer: Self-pay | Admitting: Internal Medicine

## 2016-01-07 ENCOUNTER — Other Ambulatory Visit: Payer: Self-pay | Admitting: Internal Medicine

## 2016-01-08 ENCOUNTER — Telehealth: Payer: Self-pay | Admitting: Internal Medicine

## 2016-01-09 MED ORDER — SULFASALAZINE 500 MG PO TABS: 1000.0000 mg | ORAL_TABLET | Freq: Two times a day (BID) | ORAL | 3 refills | Status: DC

## 2016-01-09 NOTE — Telephone Encounter (Signed)
Refilled Sulfasalazine 

## 2016-02-27 ENCOUNTER — Ambulatory Visit (INDEPENDENT_AMBULATORY_CARE_PROVIDER_SITE_OTHER): Payer: Medicare Other | Admitting: Internal Medicine

## 2016-02-27 ENCOUNTER — Encounter: Payer: Self-pay | Admitting: Internal Medicine

## 2016-02-27 VITALS — BP 154/60 | HR 76 | Ht 62.0 in | Wt 163.0 lb

## 2016-02-27 DIAGNOSIS — K222 Esophageal obstruction: Secondary | ICD-10-CM

## 2016-02-27 DIAGNOSIS — K5900 Constipation, unspecified: Secondary | ICD-10-CM

## 2016-02-27 DIAGNOSIS — K648 Other hemorrhoids: Secondary | ICD-10-CM | POA: Diagnosis not present

## 2016-02-27 DIAGNOSIS — K5180 Other ulcerative colitis without complications: Secondary | ICD-10-CM | POA: Diagnosis not present

## 2016-02-27 DIAGNOSIS — K2100 Gastro-esophageal reflux disease with esophagitis, without bleeding: Secondary | ICD-10-CM

## 2016-02-27 MED ORDER — SULFASALAZINE 500 MG PO TABS: 1000.0000 mg | ORAL_TABLET | Freq: Two times a day (BID) | ORAL | 3 refills | Status: DC

## 2016-02-27 MED ORDER — HYDROCORTISONE ACETATE 25 MG RE SUPP: 25.0000 mg | Freq: Every day | RECTAL | 1 refills | Status: DC

## 2016-02-27 NOTE — Patient Instructions (Signed)
We have sent the following medications to your pharmacy for you to pick up at your convenience:  Azulfadine, Anusol suppositories  You may use Metamucil, Mirlax, and stool softeners for constiaption

## 2016-02-27 NOTE — Progress Notes (Signed)
HISTORY OF PRESENT ILLNESS:  Rachel Henry is a 83 y.o. female with multiple medical problems as outlined below. She has been followed in this office for GERD complicated by erosive esophagitis and peptic stricture and pan ulcerative colitis diagnosed many years ago. She presents today with a chief complaint of bleeding hemorrhoids, constipation, and needing medication refill. For her colitis she takes Azulfidine 1 g twice daily and has done so for many years. Her colitis has been in deep remission. Last colonoscopy July 2015 was normal except for diverticulosis. Options were normal. She does have prolapsing internal hemorrhoids with intermittent bleeding. Also chronic constipation for which she would like some recommendations. In terms of GERD, she still does not want to take PPI over concerns of side effects. She does experience indigestion and heartburn for which she takes baking soda. She will not admit to dysphagia. GI review of systems is otherwise negative  REVIEW OF SYSTEMS:  All non-GI ROS negative except for visual change  Past Medical History:  Diagnosis Date  . Arthritis    hands - no meds  . Cancer ([place])    skin cancer - face   . Cataract    right eye  . Colon polyps    adenomatous  . Diabetes mellitus without complication ([place])    newly diagnosed  . Diverticulosis   . Esophageal stricture   . Glaucoma 2011   right  . Hemorrhoids   . Hiatal hernia   . Hyperlipidemia    diet controlled  . Hypertension   . Macular degeneration of right eye 2014  . SVD (spontaneous vaginal delivery)    x 3  . Ulcerative colitis ([place])   . Ulcerative esophagitis   . Varicose veins     Past Surgical History:  Procedure Laterality Date  . ANTERIOR AND POSTERIOR REPAIR N/A 08/24/2013   Procedure: ANTERIOR (CYSTOCELE) AND POSTERIOR REPAIR (RECTOCELE);  Surgeon: Annalee Genta, DO;  Location: [facility];  Service: Gynecology;  Laterality: N/A;  . APPENDECTOMY    . CATARACT EXTRACTION     bilateral  . COLONOSCOPY    . LAPAROSCOPIC ASSISTED VAGINAL HYSTERECTOMY N/A 08/24/2013   Procedure: LAPAROSCOPIC ASSISTED VAGINAL HYSTERECTOMY;  Surgeon: Annalee Genta, DO;  Location: [facility];  Service: Gynecology;  Laterality: N/A;  . OVARIAN CYST REMOVAL  1966  . SALPINGOOPHORECTOMY N/A 08/24/2013   Procedure: SALPINGO OOPHORECTOMY;  Surgeon: Annalee Genta, DO;  Location: [facility];  Service: Gynecology;  Laterality: N/A;  . TONSILLECTOMY AND ADENOIDECTOMY    . TUBAL LIGATION    . WRIST SURGERY     right    Social History Rachel Henry  reports that she quit smoking about 31 years ago. Her smoking use included Cigarettes. She smoked 0.25 packs per day. She has never used smokeless tobacco. She reports that she does not drink alcohol or use drugs.  family history includes Diabetes in her mother; Heart disease in her father and mother; Liver disease in her brother.  No Known Allergies     PHYSICAL EXAMINATION: Vital signs: BP (!) 154/60   Pulse 76   Ht 5\' 2"  (1.575 m)   Wt 163 lb (73.9 kg)   BMI 29.81 kg/m   Constitutional: generally well-appearing, no acute distress Psychiatric: alert and oriented x3, cooperative Eyes: extraocular movements intact, anicteric, conjunctiva pink Mouth: oral pharynx moist, no lesions Neck: supple no lymphadenopathy Cardiovascular: heart regular rate and rhythm, no murmur Lungs: clear to auscultation bilaterally Abdomen: soft, obese, nontender, nondistended, no  obvious ascites, no peritoneal signs, normal bowel sounds, no organomegaly Rectal: Inflamed internal hemorrhoids. Otherwise negative Extremities: no clubbing cyanosis or lower extremity edema bilaterally Skin: no lesions on visible extremities Neuro: No focal deficits. Cranial nerves intact  ASSESSMENT:  #1. Long-standing universal ulcerative colitis in deep remission on Azulfidine #2. GERD, complicated by erosive esophagitis and peptic stricture. Patient does not want to take PPI  secondary to concerns over side effects that she "heard about" #3. Functional constipation #4. Bleeding internal hemorrhoids  PLAN:  #1. Refill Azulfidine today #2. Reflux precautions #3. I did encourage her to resume PPI given the appearance of her esophagus on prior endoscopy. I reviewed her previous endoscopy report with her and showed her the pictures. I am concerned that she will develop problems related to ulcerative change and stricture. She is well informed #4. Anusol suppositories. Prescribed. Take one at night until hemorrhoids improved #5. Recommended several items for constipation including daily fiber supplementation with Metamucil, MiraLAX, stool softeners, or over-the-counter agents laxatives of choice as tolerated #6. Routine GI follow-up 2 years. Sooner if needed. Aged out of surveillance

## 2016-09-05 ENCOUNTER — Other Ambulatory Visit: Payer: Self-pay | Admitting: Internal Medicine

## 2016-10-21 ENCOUNTER — Other Ambulatory Visit: Payer: Self-pay | Admitting: Dermatology

## 2016-11-27 ENCOUNTER — Other Ambulatory Visit: Payer: Self-pay | Admitting: Dermatology

## 2017-01-08 ENCOUNTER — Other Ambulatory Visit: Payer: Self-pay | Admitting: Internal Medicine

## 2017-05-18 ENCOUNTER — Other Ambulatory Visit: Payer: Self-pay | Admitting: Dermatology

## 2017-08-13 ENCOUNTER — Other Ambulatory Visit: Payer: Self-pay | Admitting: Internal Medicine

## 2018-03-24 ENCOUNTER — Other Ambulatory Visit: Payer: Self-pay | Admitting: Internal Medicine

## 2018-05-17 ENCOUNTER — Telehealth: Payer: Self-pay

## 2018-05-17 MED ORDER — SULFASALAZINE 500 MG PO TABS: 1000.0000 mg | ORAL_TABLET | Freq: Two times a day (BID) | ORAL | 0 refills | Status: DC

## 2018-05-17 NOTE — Telephone Encounter (Signed)
Rx sent to pharmacy. Patient will need to schedule virtual visit for further refills. She is aware and will call back

## 2018-05-27 ENCOUNTER — Encounter: Payer: Self-pay | Admitting: General Surgery

## 2018-05-31 ENCOUNTER — Ambulatory Visit (INDEPENDENT_AMBULATORY_CARE_PROVIDER_SITE_OTHER): Payer: Medicare Other | Admitting: Internal Medicine

## 2018-05-31 ENCOUNTER — Encounter: Payer: Self-pay | Admitting: Internal Medicine

## 2018-05-31 ENCOUNTER — Other Ambulatory Visit: Payer: Self-pay

## 2018-05-31 VITALS — Ht 62.0 in | Wt 155.0 lb

## 2018-05-31 DIAGNOSIS — K2100 Gastro-esophageal reflux disease with esophagitis, without bleeding: Secondary | ICD-10-CM

## 2018-05-31 DIAGNOSIS — K5100 Ulcerative (chronic) pancolitis without complications: Secondary | ICD-10-CM | POA: Diagnosis not present

## 2018-05-31 DIAGNOSIS — K5901 Slow transit constipation: Secondary | ICD-10-CM | POA: Diagnosis not present

## 2018-05-31 MED ORDER — SULFASALAZINE 500 MG PO TABS: 1000.0000 mg | ORAL_TABLET | Freq: Two times a day (BID) | ORAL | 3 refills | Status: DC

## 2018-05-31 NOTE — Patient Instructions (Addendum)
If you are age 83 or older, your body mass index should be between 23-30. Your Body mass index is 28.35 kg/m. If this is out of the aforementioned range listed, please consider follow up with your Primary Care Provider.  If you are age 56 or younger, your body mass index should be between 19-25. Your Body mass index is 28.35 kg/m. If this is out of the aformentioned range listed, please consider follow up with your Primary Care Provider.   We have sent the following medications to your pharmacy for you to pick up at your convenience:  Sulfasalazine 500 mg; #120; 1 g p.o. twice daily; 11 refills  Reflux precautions (see attached)  You can initiate Prilosec OTC 20 mg daily for breakthrough reflux symptoms given your history of erosive esophagitis.  We discussed the safety profile and risks of the medication based on current available knowledge.  Use Laxative of your choice.  Routine GI follow-up 2 years.  Sooner if needed.  Ongoing general medical care with Dr. Felipa Eth  Thank you for choosing me and [facility].   Scarlette Shorts, MD   Gastroesophageal Reflux Disease, Adult Gastroesophageal reflux (GER) happens when acid from the stomach flows up into the tube that connects the mouth and the stomach (esophagus). Normally, food travels down the esophagus and stays in the stomach to be digested. However, when a person has GER, food and stomach acid sometimes move back up into the esophagus. If this becomes a more serious problem, the person may be diagnosed with a disease called gastroesophageal reflux disease (GERD). GERD occurs when the reflux:  Happens often.  Causes frequent or severe symptoms.  Causes problems such as damage to the esophagus. When stomach acid comes in contact with the esophagus, the acid may cause soreness (inflammation) in the esophagus. Over time, GERD may create small holes (ulcers) in the lining of the esophagus. What are the causes? This condition  is caused by a problem with the muscle between the esophagus and the stomach (lower esophageal sphincter, or LES). Normally, the LES muscle closes after food passes through the esophagus to the stomach. When the LES is weakened or abnormal, it does not close properly, and that allows food and stomach acid to go back up into the esophagus. The LES can be weakened by certain dietary substances, medicines, and medical conditions, including:  Tobacco use.  Pregnancy.  Having a hiatal hernia.  Alcohol use.  Certain foods and beverages, such as coffee, chocolate, onions, and peppermint. What increases the risk? You are more likely to develop this condition if you:  Have an increased body weight.  Have a connective tissue disorder.  Use NSAID medicines. What are the signs or symptoms? Symptoms of this condition include:  Heartburn.  Difficult or painful swallowing.  The feeling of having a lump in the throat.  Abitter taste in the mouth.  Bad breath.  Having a large amount of saliva.  Having an upset or bloated stomach.  Belching.  Chest pain. Different conditions can cause chest pain. Make sure you see your health care provider if you experience chest pain.  Shortness of breath or wheezing.  Ongoing (chronic) cough or a night-time cough.  Wearing away of tooth enamel.  Weight loss. How is this diagnosed? Your health care provider will take a medical history and perform a physical exam. To determine if you have mild or severe GERD, your health care provider may also monitor how you respond to treatment. You may also have  tests, including:  A test to examine your stomach and esophagus with a small camera (endoscopy).  A test thatmeasures the acidity level in your esophagus.  A test thatmeasures how much pressure is on your esophagus.  A barium swallow or modified barium swallow test to show the shape, size, and functioning of your esophagus. How is this  treated? The goal of treatment is to help relieve your symptoms and to prevent complications. Treatment for this condition may vary depending on how severe your symptoms are. Your health care provider may recommend:  Changes to your diet.  Medicine.  Surgery. Follow these instructions at home: Eating and drinking   Follow a diet as recommended by your health care provider. This may involve avoiding foods and drinks such as: ? Coffee and tea (with or without caffeine). ? Drinks that containalcohol. ? Energy drinks and sports drinks. ? Carbonated drinks or sodas. ? Chocolate and cocoa. ? Peppermint and mint flavorings. ? Garlic and onions. ? Horseradish. ? Spicy and acidic foods, including peppers, chili powder, curry powder, vinegar, hot sauces, and barbecue sauce. ? Citrus fruit juices and citrus fruits, such as oranges, lemons, and limes. ? Tomato-based foods, such as red sauce, chili, salsa, and pizza with red sauce. ? Fried and fatty foods, such as donuts, french fries, potato chips, and high-fat dressings. ? High-fat meats, such as hot dogs and fatty cuts of red and white meats, such as rib eye steak, sausage, ham, and bacon. ? High-fat dairy items, such as whole milk, butter, and cream cheese.  Eat small, frequent meals instead of large meals.  Avoid drinking large amounts of liquid with your meals.  Avoid eating meals during the 2-3 hours before bedtime.  Avoid lying down right after you eat.  Do not exercise right after you eat. Lifestyle   Do not use any products that contain nicotine or tobacco, such as cigarettes, e-cigarettes, and chewing tobacco. If you need help quitting, ask your health care provider.  Try to reduce your stress by using methods such as yoga or meditation. If you need help reducing stress, ask your health care provider.  If you are overweight, reduce your weight to an amount that is healthy for you. Ask your health care provider for guidance  about a safe weight loss goal. General instructions  Pay attention to any changes in your symptoms.  Take over-the-counter and prescription medicines only as told by your health care provider. Do not take aspirin, ibuprofen, or other NSAIDs unless your health care provider told you to do so.  Wear loose-fitting clothing. Do not wear anything tight around your waist that causes pressure on your abdomen.  Raise (elevate) the head of your bed about 6 inches (15 cm).  Avoid bending over if this makes your symptoms worse.  Keep all follow-up visits as told by your health care provider. This is important. Contact a health care provider if:  You have: ? New symptoms. ? Unexplained weight loss. ? Difficulty swallowing or it hurts to swallow. ? Wheezing or a persistent cough. ? A hoarse voice.  Your symptoms do not improve with treatment. Get help right away if you:  Have pain in your arms, neck, jaw, teeth, or back.  Feel sweaty, dizzy, or light-headed.  Have chest pain or shortness of breath.  Vomit and your vomit looks like blood or coffee grounds.  Faint.  Have stool that is bloody or black.  Cannot swallow, drink, or eat. Summary  Gastroesophageal reflux happens when  acid from the stomach flows up into the esophagus. GERD is a disease in which the reflux happens often, causes frequent or severe symptoms, or causes problems such as damage to the esophagus.  Treatment for this condition may vary depending on how severe your symptoms are. Your health care provider may recommend diet and lifestyle changes, medicine, or surgery.  Contact a health care provider if you have new or worsening symptoms.  Take over-the-counter and prescription medicines only as told by your health care provider. Do not take aspirin, ibuprofen, or other NSAIDs unless your health care provider told you to do so.  Keep all follow-up visits as told by your health care provider. This is important. This  information is not intended to replace advice given to you by your health care provider. Make sure you discuss any questions you have with your health care provider. Document Released: 10/16/2004 Document Revised: 07/15/2017 Document Reviewed: 07/15/2017 Elsevier Interactive Patient Education  2019 [employer].

## 2018-05-31 NOTE — Addendum Note (Signed)
Addended by: Candie Mile on: 05/31/2018 09:59 AM   Modules accepted: Orders

## 2018-05-31 NOTE — Progress Notes (Signed)
HISTORY OF PRESENT ILLNESS:  Rachel Henry is a 85 y.o. female who presents for telehealth visit during the coronavirus pandemic.  She has been followed for many years for chronic universal ulcerative colitis in deep remission and GERD with a history of esophagitis.  Last seen in this office February 27, 2016.  See that dictation for details.  At that time she was doing well on Azulfidine 1 g twice daily.  She has concerns over chronic PPI use and discontinued that class of medication on her own.  She did have questions regarding constipation for which recommendations were made.  She tells me that she has had no colitis symptoms.  She continues on Azulfidine without issues.  She has been having reflux symptoms for which she was on ranitidine over-the-counter until it was withdrawn from [facility].  Now using famotidine with some breakthrough symptoms.  Notes for constipation stool softeners, fiber, water.  She denies any new complaints or issues.  She does request medication refill.  Questions regarding PPI use as well.  REVIEW OF SYSTEMS:  All non-GI ROS negative unless otherwise stated in the HPI except for arthritis  Past Medical History:  Diagnosis Date  . Arthritis    hands - no meds  . Cancer ([place])    skin cancer - face   . Cataract    right eye  . Colon polyps    adenomatous  . Diabetes mellitus without complication ([place])    newly diagnosed  . Diverticulosis   . Esophageal stricture   . Glaucoma 2011   right  . Hemorrhoids   . Hiatal hernia   . Hyperlipidemia    diet controlled  . Hypertension   . Macular degeneration of right eye 2014  . SVD (spontaneous vaginal delivery)    x 3  . Ulcerative colitis ([place])   . Ulcerative esophagitis   . Varicose veins     Past Surgical History:  Procedure Laterality Date  . ANTERIOR AND POSTERIOR REPAIR N/A 08/24/2013   Procedure: ANTERIOR (CYSTOCELE) AND POSTERIOR REPAIR (RECTOCELE);  Surgeon: Annalee Genta, DO;  Location: [facility];  Service: Gynecology;  Laterality: N/A;  . APPENDECTOMY    . CATARACT EXTRACTION     bilateral  . COLONOSCOPY    . LAPAROSCOPIC ASSISTED VAGINAL HYSTERECTOMY N/A 08/24/2013   Procedure: LAPAROSCOPIC ASSISTED VAGINAL HYSTERECTOMY;  Surgeon: Annalee Genta, DO;  Location: [facility];  Service: Gynecology;  Laterality: N/A;  . OVARIAN CYST REMOVAL  1966  . SALPINGOOPHORECTOMY N/A 08/24/2013   Procedure: SALPINGO OOPHORECTOMY;  Surgeon: Annalee Genta, DO;  Location: [facility];  Service: Gynecology;  Laterality: N/A;  . TONSILLECTOMY AND ADENOIDECTOMY    . TUBAL LIGATION    . WRIST SURGERY     right    Social History ZIAIRE BIESER  reports that she quit smoking about 33 years ago. Her smoking use included cigarettes. She smoked 0.25 packs per day. She has never used smokeless tobacco. She reports that she does not drink alcohol or use drugs.  family history includes Diabetes in her mother; Heart disease in her father and mother; Liver disease in her brother.  No Known Allergies     PHYSICAL EXAMINATION: No physical examination with telehealth visit  ASSESSMENT:  1.  Universal ulcerative colitis.  Remains in deep remission.  Last colonoscopy July 2015.  She has aged out of surveillance 2.  GERD with a history of erosive esophagitis.  Some breakthrough symptoms on famotidine 3.  Functional constipation.  Managed  PLAN:  1.  REFILL PRESCRIPTION sulfasalazine 500 mg; #120; 1 g p.o. twice daily; 11 refills 2.  Reflux precautions 3.  Told the patient that she could initiate Prilosec OTC 20 mg daily for breakthrough reflux symptoms given her history of erosive esophagitis.  We discussed the safety profile and risks of the medication based on current available knowledge 4.  Laxative of choice 5.  Routine GI follow-up 2 years.  Sooner if needed.  Ongoing general medical care with Dr. Felipa Eth This telehealth visit (Medicare patient during the coronavirus pandemic) was between the patient  and myself.  She initiated the visit and consented for the visit.  She was in her home and I was in my office during the encounter.  She understands there may be an associated professional charge for this service

## 2019-06-17 ENCOUNTER — Other Ambulatory Visit: Payer: Self-pay | Admitting: Internal Medicine

## 2019-08-24 ENCOUNTER — Other Ambulatory Visit: Payer: Self-pay | Admitting: Internal Medicine

## 2019-11-15 ENCOUNTER — Encounter: Payer: Self-pay | Admitting: Dermatology

## 2019-11-15 ENCOUNTER — Ambulatory Visit: Payer: Medicare Other | Admitting: Dermatology

## 2019-11-15 ENCOUNTER — Other Ambulatory Visit: Payer: Self-pay

## 2019-11-15 DIAGNOSIS — L918 Other hypertrophic disorders of the skin: Secondary | ICD-10-CM | POA: Diagnosis not present

## 2019-11-15 DIAGNOSIS — D485 Neoplasm of uncertain behavior of skin: Secondary | ICD-10-CM

## 2019-11-15 DIAGNOSIS — C4441 Basal cell carcinoma of skin of scalp and neck: Secondary | ICD-10-CM | POA: Diagnosis not present

## 2019-11-15 DIAGNOSIS — Z1283 Encounter for screening for malignant neoplasm of skin: Secondary | ICD-10-CM

## 2019-11-15 DIAGNOSIS — Z85828 Personal history of other malignant neoplasm of skin: Secondary | ICD-10-CM

## 2019-11-15 DIAGNOSIS — C44519 Basal cell carcinoma of skin of other part of trunk: Secondary | ICD-10-CM

## 2019-11-21 ENCOUNTER — Telehealth: Payer: Self-pay

## 2019-11-21 NOTE — Telephone Encounter (Signed)
-----   Message from Lavonna Monarch, MD sent at 11/19/2019  4:11 AM EDT ----- Schedule surgery with Dr. Darene Lamer

## 2019-11-21 NOTE — Telephone Encounter (Signed)
Left message to call needs 30 min

## 2019-11-23 ENCOUNTER — Other Ambulatory Visit: Payer: Self-pay | Admitting: Internal Medicine

## 2019-11-28 ENCOUNTER — Telehealth: Payer: Self-pay

## 2019-11-28 NOTE — Telephone Encounter (Signed)
Path to patient  

## 2019-11-28 NOTE — Telephone Encounter (Signed)
-----   Message from Lavonna Monarch, MD sent at 11/19/2019  4:11 AM EDT ----- Schedule surgery with Dr. Darene Lamer

## 2019-12-15 ENCOUNTER — Encounter: Payer: Self-pay | Admitting: Dermatology

## 2019-12-15 NOTE — Progress Notes (Signed)
° °  Follow-Up Visit   Subjective  Rachel Henry is a 86 y.o. female who presents for the following: Annual Exam (LEFT EAR PREVIOUS BX, LEFT NECK TAG).  General skin check Location: Possible new spots left scalp and left collarbone Duration:  Quality:  Associated Signs/Symptoms: Modifying Factors:  Severity:  Timing: Context:   Objective  Well appearing patient in no apparent distress; mood and affect are within normal limits.  A full examination was performed including scalp, head, eyes, ears, nose, lips, neck, chest, axillae, abdomen, back, buttocks, bilateral upper extremities, bilateral lower extremities, hands, feet, fingers, toes, fingernails, and toenails. All findings within normal limits unless otherwise noted below.   Assessment & Plan    Screening exam for skin cancer Chest - Medial [facility])  Annual skin check  History of basal cell cancer Head - Anterior (Face)  Self examine her skin twice annually  Skin tag Neck - Anterior  Scissor excision (no procedure charge)  Neoplasm of uncertain behavior of skin (2) Left COLLAR BONE  Skin / nail biopsy Type of biopsy: tangential   Informed consent: discussed and consent obtained   Timeout: patient name, date of birth, surgical site, and procedure verified   Procedure prep:  Patient was prepped and draped in usual sterile fashion (Non sterile) Prep type:  Chlorhexidine Anesthesia: the lesion was anesthetized in a standard fashion   Anesthetic:  1% lidocaine w/ epinephrine 1-100,000 local infiltration Instrument used: flexible razor blade   Outcome: patient tolerated procedure well   Post-procedure details: wound care instructions given    Specimen 1 - Surgical pathology Differential Diagnosis: bcc scc Check Margins: No  Left Occipital Scalp  Skin / nail biopsy Type of biopsy: tangential   Informed consent: discussed and consent obtained   Timeout: patient name, date of birth, surgical site, and  procedure verified   Procedure prep:  Patient was prepped and draped in usual sterile fashion (Non sterile) Prep type:  Chlorhexidine Anesthesia: the lesion was anesthetized in a standard fashion   Anesthetic:  1% lidocaine w/ epinephrine 1-100,000 local infiltration Instrument used: flexible razor blade   Outcome: patient tolerated procedure well   Post-procedure details: wound care instructions given   Additional details:  If + MOHS  Specimen 2 - Surgical pathology Differential Diagnosis: bcc scc Check Margins: No     I, Lavonna Monarch, MD, have reviewed all documentation for this visit.  The documentation on 12/15/19 for the exam, diagnosis, procedures, and orders are all accurate and complete.

## 2020-01-18 ENCOUNTER — Encounter: Payer: Medicare Other | Admitting: Dermatology

## 2020-02-29 DIAGNOSIS — H401113 Primary open-angle glaucoma, right eye, severe stage: Secondary | ICD-10-CM | POA: Diagnosis not present

## 2020-02-29 DIAGNOSIS — H401121 Primary open-angle glaucoma, left eye, mild stage: Secondary | ICD-10-CM | POA: Diagnosis not present

## 2020-04-14 DIAGNOSIS — K219 Gastro-esophageal reflux disease without esophagitis: Secondary | ICD-10-CM | POA: Diagnosis not present

## 2020-04-14 DIAGNOSIS — E7800 Pure hypercholesterolemia, unspecified: Secondary | ICD-10-CM | POA: Diagnosis not present

## 2020-04-14 DIAGNOSIS — E1121 Type 2 diabetes mellitus with diabetic nephropathy: Secondary | ICD-10-CM | POA: Diagnosis not present

## 2020-04-14 DIAGNOSIS — N1831 Chronic kidney disease, stage 3a: Secondary | ICD-10-CM | POA: Diagnosis not present

## 2020-04-14 DIAGNOSIS — I129 Hypertensive chronic kidney disease with stage 1 through stage 4 chronic kidney disease, or unspecified chronic kidney disease: Secondary | ICD-10-CM | POA: Diagnosis not present

## 2020-05-01 DIAGNOSIS — N1831 Chronic kidney disease, stage 3a: Secondary | ICD-10-CM | POA: Diagnosis not present

## 2020-05-01 DIAGNOSIS — I129 Hypertensive chronic kidney disease with stage 1 through stage 4 chronic kidney disease, or unspecified chronic kidney disease: Secondary | ICD-10-CM | POA: Diagnosis not present

## 2020-05-01 DIAGNOSIS — E1121 Type 2 diabetes mellitus with diabetic nephropathy: Secondary | ICD-10-CM | POA: Diagnosis not present

## 2020-05-01 DIAGNOSIS — E7800 Pure hypercholesterolemia, unspecified: Secondary | ICD-10-CM | POA: Diagnosis not present

## 2020-05-03 DIAGNOSIS — H353221 Exudative age-related macular degeneration, left eye, with active choroidal neovascularization: Secondary | ICD-10-CM | POA: Diagnosis not present

## 2020-05-03 DIAGNOSIS — H401113 Primary open-angle glaucoma, right eye, severe stage: Secondary | ICD-10-CM | POA: Diagnosis not present

## 2020-05-03 DIAGNOSIS — H353112 Nonexudative age-related macular degeneration, right eye, intermediate dry stage: Secondary | ICD-10-CM | POA: Diagnosis not present

## 2020-05-08 DIAGNOSIS — E1121 Type 2 diabetes mellitus with diabetic nephropathy: Secondary | ICD-10-CM | POA: Diagnosis not present

## 2020-05-08 DIAGNOSIS — K219 Gastro-esophageal reflux disease without esophagitis: Secondary | ICD-10-CM | POA: Diagnosis not present

## 2020-05-08 DIAGNOSIS — N1831 Chronic kidney disease, stage 3a: Secondary | ICD-10-CM | POA: Diagnosis not present

## 2020-05-08 DIAGNOSIS — I129 Hypertensive chronic kidney disease with stage 1 through stage 4 chronic kidney disease, or unspecified chronic kidney disease: Secondary | ICD-10-CM | POA: Diagnosis not present

## 2020-05-08 DIAGNOSIS — E7800 Pure hypercholesterolemia, unspecified: Secondary | ICD-10-CM | POA: Diagnosis not present

## 2020-07-10 ENCOUNTER — Ambulatory Visit: Payer: Medicare Other | Admitting: Dermatology

## 2020-09-03 DIAGNOSIS — H401113 Primary open-angle glaucoma, right eye, severe stage: Secondary | ICD-10-CM | POA: Diagnosis not present

## 2020-09-03 DIAGNOSIS — H401121 Primary open-angle glaucoma, left eye, mild stage: Secondary | ICD-10-CM | POA: Diagnosis not present

## 2020-09-05 ENCOUNTER — Ambulatory Visit (INDEPENDENT_AMBULATORY_CARE_PROVIDER_SITE_OTHER): Payer: Medicare Other | Admitting: Dermatology

## 2020-09-05 ENCOUNTER — Encounter: Payer: Self-pay | Admitting: Dermatology

## 2020-09-05 ENCOUNTER — Other Ambulatory Visit: Payer: Self-pay

## 2020-09-05 DIAGNOSIS — D485 Neoplasm of uncertain behavior of skin: Secondary | ICD-10-CM

## 2020-09-05 DIAGNOSIS — C44219 Basal cell carcinoma of skin of left ear and external auricular canal: Secondary | ICD-10-CM | POA: Diagnosis not present

## 2020-09-05 DIAGNOSIS — C4491 Basal cell carcinoma of skin, unspecified: Secondary | ICD-10-CM

## 2020-09-05 HISTORY — DX: Basal cell carcinoma of skin, unspecified: C44.91

## 2020-09-05 NOTE — Patient Instructions (Signed)

## 2020-09-06 DIAGNOSIS — H353221 Exudative age-related macular degeneration, left eye, with active choroidal neovascularization: Secondary | ICD-10-CM | POA: Diagnosis not present

## 2020-09-06 DIAGNOSIS — H353112 Nonexudative age-related macular degeneration, right eye, intermediate dry stage: Secondary | ICD-10-CM | POA: Diagnosis not present

## 2020-09-06 DIAGNOSIS — H401113 Primary open-angle glaucoma, right eye, severe stage: Secondary | ICD-10-CM | POA: Diagnosis not present

## 2020-09-12 ENCOUNTER — Telehealth: Payer: Self-pay | Admitting: *Deleted

## 2020-09-12 NOTE — Telephone Encounter (Signed)
-----   Message from Lavonna Monarch, MD sent at 09/12/2020  5:00 AM EDT ----- Schedule Mohs

## 2020-09-12 NOTE — Telephone Encounter (Signed)
Lest message for patient to call back for pathology results.

## 2020-09-17 ENCOUNTER — Encounter: Payer: Self-pay | Admitting: Dermatology

## 2020-09-17 NOTE — Progress Notes (Signed)
   Follow-Up Visit   Subjective  Rachel Henry is a 87 y.o. female who presents for the following: Follow-up (Left post ear crusty lesion x months ).  Growth behind left ear for several months Location:  Duration:  Quality:  Associated Signs/Symptoms: Modifying Factors:  Severity:  Timing: Context:   Objective  Well appearing patient in no apparent distress; mood and affect are within normal limits. Left Posterior Auricle 1.8cm centrally eroded pearly nodule: BCC. May refer for Mohs.       A focused examination was performed including head and neck. Relevant physical exam findings are noted in the Assessment and Plan.   Assessment & Plan    Neoplasm of uncertain behavior of skin Left Posterior Auricle  Skin / nail biopsy Type of biopsy: tangential   Informed consent: discussed and consent obtained   Timeout: patient name, date of birth, surgical site, and procedure verified   Anesthesia: the lesion was anesthetized in a standard fashion   Anesthetic:  1% lidocaine w/ epinephrine 1-100,000 local infiltration Instrument used: flexible razor blade   Hemostasis achieved with: ferric subsulfate   Outcome: patient tolerated procedure well   Post-procedure details: wound care instructions given    Specimen 1 - Surgical pathology Differential Diagnosis: BCC  Check Margins: No      I, Lavonna Monarch, MD, have reviewed all documentation for this visit.  The documentation on 09/17/20 for the exam, diagnosis, procedures, and orders are all accurate and complete.

## 2020-09-26 ENCOUNTER — Telehealth: Payer: Self-pay | Admitting: *Deleted

## 2020-09-26 ENCOUNTER — Encounter: Payer: Self-pay | Admitting: *Deleted

## 2020-09-26 NOTE — Telephone Encounter (Signed)
Called emergency contact bryan- we had 2 failed attempts at trying to reach patient- was able to get in contact with Gaspar Bidding- results given to bryan and referral sent to skin surgery center for MOHS.

## 2020-11-07 DIAGNOSIS — Z79899 Other long term (current) drug therapy: Secondary | ICD-10-CM | POA: Diagnosis not present

## 2020-11-07 DIAGNOSIS — Z23 Encounter for immunization: Secondary | ICD-10-CM | POA: Diagnosis not present

## 2020-11-07 DIAGNOSIS — E7800 Pure hypercholesterolemia, unspecified: Secondary | ICD-10-CM | POA: Diagnosis not present

## 2020-11-07 DIAGNOSIS — M8580 Other specified disorders of bone density and structure, unspecified site: Secondary | ICD-10-CM | POA: Diagnosis not present

## 2020-11-07 DIAGNOSIS — I129 Hypertensive chronic kidney disease with stage 1 through stage 4 chronic kidney disease, or unspecified chronic kidney disease: Secondary | ICD-10-CM | POA: Diagnosis not present

## 2020-11-07 DIAGNOSIS — Z1389 Encounter for screening for other disorder: Secondary | ICD-10-CM | POA: Diagnosis not present

## 2020-11-07 DIAGNOSIS — N1831 Chronic kidney disease, stage 3a: Secondary | ICD-10-CM | POA: Diagnosis not present

## 2020-11-07 DIAGNOSIS — K5120 Ulcerative (chronic) proctitis without complications: Secondary | ICD-10-CM | POA: Diagnosis not present

## 2020-11-07 DIAGNOSIS — Z780 Asymptomatic menopausal state: Secondary | ICD-10-CM | POA: Diagnosis not present

## 2020-11-07 DIAGNOSIS — D649 Anemia, unspecified: Secondary | ICD-10-CM | POA: Diagnosis not present

## 2020-11-07 DIAGNOSIS — K219 Gastro-esophageal reflux disease without esophagitis: Secondary | ICD-10-CM | POA: Diagnosis not present

## 2020-11-07 DIAGNOSIS — G4733 Obstructive sleep apnea (adult) (pediatric): Secondary | ICD-10-CM | POA: Diagnosis not present

## 2020-11-07 DIAGNOSIS — Z0000 Encounter for general adult medical examination without abnormal findings: Secondary | ICD-10-CM | POA: Diagnosis not present

## 2020-12-03 ENCOUNTER — Other Ambulatory Visit: Payer: Self-pay | Admitting: Internal Medicine

## 2021-01-03 DIAGNOSIS — H353221 Exudative age-related macular degeneration, left eye, with active choroidal neovascularization: Secondary | ICD-10-CM | POA: Diagnosis not present

## 2021-01-03 DIAGNOSIS — H401113 Primary open-angle glaucoma, right eye, severe stage: Secondary | ICD-10-CM | POA: Diagnosis not present

## 2021-01-03 DIAGNOSIS — H353112 Nonexudative age-related macular degeneration, right eye, intermediate dry stage: Secondary | ICD-10-CM | POA: Diagnosis not present

## 2021-04-30 DIAGNOSIS — H409 Unspecified glaucoma: Secondary | ICD-10-CM | POA: Diagnosis not present

## 2021-04-30 DIAGNOSIS — D84821 Immunodeficiency due to drugs: Secondary | ICD-10-CM | POA: Diagnosis not present

## 2021-04-30 DIAGNOSIS — K5190 Ulcerative colitis, unspecified, without complications: Secondary | ICD-10-CM | POA: Diagnosis not present

## 2021-04-30 DIAGNOSIS — E119 Type 2 diabetes mellitus without complications: Secondary | ICD-10-CM | POA: Diagnosis not present

## 2021-04-30 DIAGNOSIS — Z7984 Long term (current) use of oral hypoglycemic drugs: Secondary | ICD-10-CM | POA: Diagnosis not present

## 2021-04-30 DIAGNOSIS — E785 Hyperlipidemia, unspecified: Secondary | ICD-10-CM | POA: Diagnosis not present

## 2021-04-30 DIAGNOSIS — R32 Unspecified urinary incontinence: Secondary | ICD-10-CM | POA: Diagnosis not present

## 2021-04-30 DIAGNOSIS — Z7722 Contact with and (suspected) exposure to environmental tobacco smoke (acute) (chronic): Secondary | ICD-10-CM | POA: Diagnosis not present

## 2021-04-30 DIAGNOSIS — I10 Essential (primary) hypertension: Secondary | ICD-10-CM | POA: Diagnosis not present

## 2021-05-30 ENCOUNTER — Other Ambulatory Visit: Payer: Self-pay | Admitting: Internal Medicine

## 2021-07-24 DIAGNOSIS — E1121 Type 2 diabetes mellitus with diabetic nephropathy: Secondary | ICD-10-CM | POA: Diagnosis not present

## 2021-07-24 DIAGNOSIS — N1831 Chronic kidney disease, stage 3a: Secondary | ICD-10-CM | POA: Diagnosis not present

## 2021-07-24 DIAGNOSIS — I129 Hypertensive chronic kidney disease with stage 1 through stage 4 chronic kidney disease, or unspecified chronic kidney disease: Secondary | ICD-10-CM | POA: Diagnosis not present

## 2021-08-29 ENCOUNTER — Other Ambulatory Visit: Payer: Self-pay | Admitting: Internal Medicine

## 2021-08-30 ENCOUNTER — Other Ambulatory Visit: Payer: Self-pay | Admitting: Internal Medicine

## 2021-08-30 ENCOUNTER — Telehealth: Payer: Self-pay | Admitting: Internal Medicine

## 2021-08-30 MED ORDER — SULFASALAZINE 500 MG PO TABS: 1000.0000 mg | ORAL_TABLET | Freq: Two times a day (BID) | ORAL | 0 refills | Status: DC

## 2021-08-30 NOTE — Telephone Encounter (Signed)
Script sent to patient's pharmacy.

## 2021-08-30 NOTE — Telephone Encounter (Signed)
Patient called and scheduled an appointment for a medication refill for  9/14 at 11:00. Patient needs AZULFIDINE refilled. Please advise.

## 2021-09-05 DIAGNOSIS — H401122 Primary open-angle glaucoma, left eye, moderate stage: Secondary | ICD-10-CM | POA: Diagnosis not present

## 2021-09-05 DIAGNOSIS — H353221 Exudative age-related macular degeneration, left eye, with active choroidal neovascularization: Secondary | ICD-10-CM | POA: Diagnosis not present

## 2021-09-05 DIAGNOSIS — H353112 Nonexudative age-related macular degeneration, right eye, intermediate dry stage: Secondary | ICD-10-CM | POA: Diagnosis not present

## 2021-09-05 DIAGNOSIS — H401113 Primary open-angle glaucoma, right eye, severe stage: Secondary | ICD-10-CM | POA: Diagnosis not present

## 2021-10-03 ENCOUNTER — Ambulatory Visit: Payer: Medicare PPO | Admitting: Internal Medicine

## 2021-10-03 ENCOUNTER — Encounter: Payer: Self-pay | Admitting: Internal Medicine

## 2021-10-03 VITALS — BP 124/68 | HR 67 | Ht 63.0 in | Wt 154.0 lb

## 2021-10-03 DIAGNOSIS — K648 Other hemorrhoids: Secondary | ICD-10-CM | POA: Diagnosis not present

## 2021-10-03 DIAGNOSIS — K51819 Other ulcerative colitis with unspecified complications: Secondary | ICD-10-CM

## 2021-10-03 DIAGNOSIS — K625 Hemorrhage of anus and rectum: Secondary | ICD-10-CM

## 2021-10-03 MED ORDER — SULFASALAZINE 500 MG PO TABS: 1000.0000 mg | ORAL_TABLET | Freq: Two times a day (BID) | ORAL | 3 refills | Status: DC

## 2021-10-03 MED ORDER — HYDROCORTISONE (PERIANAL) 2.5 % EX CREA: 1.0000 | TOPICAL_CREAM | Freq: Every evening | CUTANEOUS | 1 refills | Status: DC

## 2021-10-03 NOTE — Patient Instructions (Signed)
_______________________________________________________  If you are age 86 or older, your body mass index should be between 23-30. Your Body mass index is 27.28 kg/m. If this is out of the aforementioned range listed, please consider follow up with your Primary Care Provider.  If you are age 32 or younger, your body mass index should be between 19-25. Your Body mass index is 27.28 kg/m. If this is out of the aformentioned range listed, please consider follow up with your Primary Care Provider.   ________________________________________________________  The [facility] providers would like to encourage you to use [facility] to communicate with providers for non-urgent requests or questions.  Due to long hold times on the telephone, sending your provider a message by [facility] may be a faster and more efficient way to get a response.  Please allow 48 business hours for a response.  Please remember that this is for non-urgent requests.  _______________________________________________________  We have sent the following medications to your pharmacy for you to pick up at your convenience:  Sulfasalazine, Anusol HC cream   Purchase Preparation H suppositories over the counter.  Put a small amount of the cream on the suppository and insert rectally at night

## 2021-10-03 NOTE — Progress Notes (Signed)
HISTORY OF PRESENT ILLNESS:  Rachel Henry is a 88 y.o. female with a remote history of chronic universal colitis who has been in deep remission for some time.  Also history of GERD complicated by erosive esophagitis.  She was last seen via telehealth medicine May 31, 2018.  Patient presents today for follow-up and request medication refill.  She continues on sulfasalazine 1000 mg twice daily.  She reports normal bowel habits.  No abdominal pain.  She does report problems with rectal bleeding over the past 6 months.  Generally bright red and on the tissue.  Occasionally seen on the stool and toilet bowl.  No associated rectal discomfort.  Last complete colonoscopy July 27, 2013.  Examination was normal except for sigmoid diverticulosis.  She is known to have internal hemorrhoids.  Biopsy showed unremarkable colonic mucosa.  REVIEW OF SYSTEMS:  All non-GI ROS negative unless otherwise stated in the HPI.  Past Medical History:  Diagnosis Date   Arthritis    hands - no meds   Basal cell carcinoma 05/23/1997   Post Left Neck (curet 5FU)   BCC (basal cell carcinoma of skin) 08/09/2002   Left Chin (excision)   BCC (basal cell carcinoma of skin) 07/25/2009   Right Upper Nose   BCC (basal cell carcinoma of skin) 07/25/2009   Right Nostril (ulcerated)   BCC (basal cell carcinoma of skin) 01/06/2012   Upper Forehead (curet and 5FU)   BCC (basal cell carcinoma of skin) 01/06/2012   Left Arm (curet and 5FU)   Cancer ([place])    skin cancer - face    Cataract    right eye   Colon polyps    adenomatous   Diabetes mellitus without complication ([place])    newly diagnosed   Diverticulosis    Esophageal stricture    Glaucoma 2011   right   Hemorrhoids    Hiatal hernia    Hyperlipidemia    diet controlled   Hypertension    Macular degeneration of right eye 2014   Nodular basal cell carcinoma (BCC) 09/05/2020   left post auricle (MOHS)   Superficial basal cell carcinoma (BCC) 09/17/2004    Right Jawline,Neck (curet and excision)   Superficial basal cell carcinoma (BCC) 10/21/2016   Right Shoulder (curet and 5FU)   Superficial basal cell carcinoma (BCC) 10/21/2016   Right Cheek (curet and 5FU)   Superficial basal cell carcinoma (BCC) 11/27/2016   Right Deltoid (tx p bx)   Superficial nodular basal cell carcinoma (BCC) 10/16/2015   Right Forearm (recheck 3 month)   Superficial nodular basal cell carcinoma (BCC) 10/21/2016   Left Post Ear (curet and 5FU)   Superficial nodular basal cell carcinoma (BCC) 05/18/2017   Right Cheek (MOH's)   SVD (spontaneous vaginal delivery)    x 3   Ulcerative colitis ([place])    Ulcerative esophagitis    Varicose veins     Past Surgical History:  Procedure Laterality Date   ANTERIOR AND POSTERIOR REPAIR N/A 08/24/2013   Procedure: ANTERIOR (CYSTOCELE) AND POSTERIOR REPAIR (RECTOCELE);  Surgeon: Annalee Genta, DO;  Location: [facility];  Service: Gynecology;  Laterality: N/A;   APPENDECTOMY     CATARACT EXTRACTION     bilateral   COLONOSCOPY     LAPAROSCOPIC ASSISTED VAGINAL HYSTERECTOMY N/A 08/24/2013   Procedure: LAPAROSCOPIC ASSISTED VAGINAL HYSTERECTOMY;  Surgeon: Annalee Genta, DO;  Location: [facility];  Service: Gynecology;  Laterality: N/A;   OVARIAN CYST REMOVAL  1966   SALPINGOOPHORECTOMY N/A  08/24/2013   Procedure: SALPINGO OOPHORECTOMY;  Surgeon: Annalee Genta, DO;  Location: [facility];  Service: Gynecology;  Laterality: N/A;   TONSILLECTOMY AND ADENOIDECTOMY     TUBAL LIGATION     WRIST SURGERY     right    Social History Rachel Henry  reports that she quit smoking about 36 years ago. Her smoking use included cigarettes. She smoked an average of .25 packs per day. She has never used smokeless tobacco. She reports that she does not drink alcohol and does not use drugs.  family history includes Diabetes in her mother; Heart disease in her father and mother; Liver disease in her brother.  No Known Allergies     PHYSICAL  EXAMINATION: Vital signs: BP 124/68   Pulse 67   Ht '5\' 3"'$  (1.6 m)   Wt 154 lb (69.9 kg)   SpO2 96%   BMI 27.28 kg/m   Constitutional: generally well-appearing, no acute distress Psychiatric: alert and oriented x3, cooperative Eyes: extraocular movements intact, anicteric, conjunctiva pink Mouth: oral pharynx moist, no lesions Neck: supple no lymphadenopathy Cardiovascular: heart regular rate and rhythm, no murmur Lungs: clear to auscultation bilaterally Abdomen: soft, nontender, nondistended, no obvious ascites, no peritoneal signs, normal bowel sounds, no organomegaly Rectal: Noninflamed external hemorrhoids.  Inflamed moderate-sized internal hemorrhoids. Extremities: no clubbing, cyanosis, or lower extremity edema bilaterally Skin: no lesions on visible extremities Neuro: No focal deficits. No asterixis.     ASSESSMENT:  1.  Remote history of universal ulcerative colitis.  Has been a deep remission for some time.  Last colonoscopy 2015 was normal endoscopically and microscopically. 2.  Recent problems with minor rectal bleeding.  Due to hemorrhoids 3.  GERD.  On no medications.  Asymptomatic   PLAN:  1.  Refill sulfasalazine.  Medication risk reviewed 2.  Prescribed Anusol HC medicated suppositories for hemorrhoids 3.  Reflux precautions 4.  Routine office follow-up 2 years A total time of 45 minutes was spent preparing to see the patient, performing comprehensive history, performing medically appropriate physical examination, counseling and educating the patient regarding the above listed issues, ordering multiple medications, and documenting clinical information in the health record.

## 2021-11-29 DIAGNOSIS — Z0000 Encounter for general adult medical examination without abnormal findings: Secondary | ICD-10-CM | POA: Diagnosis not present

## 2021-11-29 DIAGNOSIS — K219 Gastro-esophageal reflux disease without esophagitis: Secondary | ICD-10-CM | POA: Diagnosis not present

## 2021-11-29 DIAGNOSIS — E7800 Pure hypercholesterolemia, unspecified: Secondary | ICD-10-CM | POA: Diagnosis not present

## 2021-11-29 DIAGNOSIS — K5120 Ulcerative (chronic) proctitis without complications: Secondary | ICD-10-CM | POA: Diagnosis not present

## 2021-11-29 DIAGNOSIS — N1831 Chronic kidney disease, stage 3a: Secondary | ICD-10-CM | POA: Diagnosis not present

## 2021-11-29 DIAGNOSIS — E1121 Type 2 diabetes mellitus with diabetic nephropathy: Secondary | ICD-10-CM | POA: Diagnosis not present

## 2021-11-29 DIAGNOSIS — G4733 Obstructive sleep apnea (adult) (pediatric): Secondary | ICD-10-CM | POA: Diagnosis not present

## 2021-11-29 DIAGNOSIS — I129 Hypertensive chronic kidney disease with stage 1 through stage 4 chronic kidney disease, or unspecified chronic kidney disease: Secondary | ICD-10-CM | POA: Diagnosis not present

## 2021-11-29 DIAGNOSIS — R718 Other abnormality of red blood cells: Secondary | ICD-10-CM | POA: Diagnosis not present

## 2021-11-29 DIAGNOSIS — Z79899 Other long term (current) drug therapy: Secondary | ICD-10-CM | POA: Diagnosis not present

## 2021-11-29 DIAGNOSIS — Z1331 Encounter for screening for depression: Secondary | ICD-10-CM | POA: Diagnosis not present

## 2021-11-29 DIAGNOSIS — M8580 Other specified disorders of bone density and structure, unspecified site: Secondary | ICD-10-CM | POA: Diagnosis not present

## 2021-12-23 DIAGNOSIS — H401122 Primary open-angle glaucoma, left eye, moderate stage: Secondary | ICD-10-CM | POA: Diagnosis not present

## 2021-12-23 DIAGNOSIS — H401113 Primary open-angle glaucoma, right eye, severe stage: Secondary | ICD-10-CM | POA: Diagnosis not present

## 2022-05-08 DIAGNOSIS — H353112 Nonexudative age-related macular degeneration, right eye, intermediate dry stage: Secondary | ICD-10-CM | POA: Diagnosis not present

## 2022-05-08 DIAGNOSIS — H353221 Exudative age-related macular degeneration, left eye, with active choroidal neovascularization: Secondary | ICD-10-CM | POA: Diagnosis not present

## 2022-05-22 ENCOUNTER — Other Ambulatory Visit: Payer: Self-pay | Admitting: Internal Medicine

## 2022-05-29 DIAGNOSIS — K219 Gastro-esophageal reflux disease without esophagitis: Secondary | ICD-10-CM | POA: Diagnosis not present

## 2022-05-29 DIAGNOSIS — Z79899 Other long term (current) drug therapy: Secondary | ICD-10-CM | POA: Diagnosis not present

## 2022-05-29 DIAGNOSIS — M8580 Other specified disorders of bone density and structure, unspecified site: Secondary | ICD-10-CM | POA: Diagnosis not present

## 2022-05-29 DIAGNOSIS — K5120 Ulcerative (chronic) proctitis without complications: Secondary | ICD-10-CM | POA: Diagnosis not present

## 2022-05-29 DIAGNOSIS — E7800 Pure hypercholesterolemia, unspecified: Secondary | ICD-10-CM | POA: Diagnosis not present

## 2022-05-29 DIAGNOSIS — N1831 Chronic kidney disease, stage 3a: Secondary | ICD-10-CM | POA: Diagnosis not present

## 2022-05-29 DIAGNOSIS — E1122 Type 2 diabetes mellitus with diabetic chronic kidney disease: Secondary | ICD-10-CM | POA: Diagnosis not present

## 2022-05-29 DIAGNOSIS — I129 Hypertensive chronic kidney disease with stage 1 through stage 4 chronic kidney disease, or unspecified chronic kidney disease: Secondary | ICD-10-CM | POA: Diagnosis not present

## 2022-05-29 DIAGNOSIS — G4733 Obstructive sleep apnea (adult) (pediatric): Secondary | ICD-10-CM | POA: Diagnosis not present

## 2022-08-11 ENCOUNTER — Ambulatory Visit: Payer: Medicare PPO | Admitting: Dermatology

## 2022-08-11 VITALS — BP 147/87 | HR 58

## 2022-08-11 DIAGNOSIS — L570 Actinic keratosis: Secondary | ICD-10-CM | POA: Diagnosis not present

## 2022-08-11 DIAGNOSIS — Z1283 Encounter for screening for malignant neoplasm of skin: Secondary | ICD-10-CM | POA: Diagnosis not present

## 2022-08-11 DIAGNOSIS — C4441 Basal cell carcinoma of skin of scalp and neck: Secondary | ICD-10-CM | POA: Diagnosis not present

## 2022-08-11 DIAGNOSIS — C44319 Basal cell carcinoma of skin of other parts of face: Secondary | ICD-10-CM

## 2022-08-11 DIAGNOSIS — D229 Melanocytic nevi, unspecified: Secondary | ICD-10-CM

## 2022-08-11 DIAGNOSIS — L821 Other seborrheic keratosis: Secondary | ICD-10-CM

## 2022-08-11 DIAGNOSIS — W908XXA Exposure to other nonionizing radiation, initial encounter: Secondary | ICD-10-CM

## 2022-08-11 DIAGNOSIS — L814 Other melanin hyperpigmentation: Secondary | ICD-10-CM | POA: Diagnosis not present

## 2022-08-11 DIAGNOSIS — L578 Other skin changes due to chronic exposure to nonionizing radiation: Secondary | ICD-10-CM

## 2022-08-11 DIAGNOSIS — D1801 Hemangioma of skin and subcutaneous tissue: Secondary | ICD-10-CM

## 2022-08-11 DIAGNOSIS — C4491 Basal cell carcinoma of skin, unspecified: Secondary | ICD-10-CM

## 2022-08-11 DIAGNOSIS — Z85828 Personal history of other malignant neoplasm of skin: Secondary | ICD-10-CM

## 2022-08-11 DIAGNOSIS — D485 Neoplasm of uncertain behavior of skin: Secondary | ICD-10-CM

## 2022-08-11 NOTE — Progress Notes (Unsigned)
New Patient Visit   Subjective  Rachel Henry is a 89 y.o. female who presents for the following: Bx proven BCC left posterior auricle 09/05/20 by Dr. Jorja Loa. He recommended Mohs, but patient's husband became sick, and she never scheduled the procedure. The patient has spots, moles and lesions to be evaluated, some may be new or changing and the patient may have concern these could be cancer.  The following portions of the chart were reviewed this encounter and updated as appropriate: medications, allergies, medical history  Review of Systems:  No other skin or systemic complaints except as noted in HPI or Assessment and Plan.  Objective  Well appearing patient in no apparent distress; mood and affect are within normal limits.   A focused examination was performed of the following areas: the face, scalp, ears, trunk, arms, hands   Relevant exam findings are noted in the Assessment and Plan.  L posterior auricular     Forehead 7.0 mm pink pearly papule on the forehead     R neck 7.0 x 3.0 mm oval pink papule on the R neck     L neck x 2, forehead x 1, R temple x 1, (4) Erythematous thin papules/macules with gritty scale.     Assessment & Plan   ACTINIC DAMAGE - chronic, secondary to cumulative UV radiation exposure/sun exposure over time - diffuse scaly erythematous macules with underlying dyspigmentation - Recommend daily broad spectrum sunscreen SPF 30+ to sun-exposed areas, reapply every 2 hours as needed.  - Recommend staying in the shade or wearing long sleeves, sun glasses (UVA+UVB protection) and wide brim hats (4-inch brim around the entire circumference of the hat). - Call for new or changing lesions.  LENTIGINES Exam: scattered tan macules Due to sun exposure Treatment Plan: Benign-appearing, observe. Recommend daily broad spectrum sunscreen SPF 30+ to sun-exposed areas, reapply every 2 hours as needed.  Call for any changes  SEBORRHEIC  KERATOSIS - Stuck-on, waxy, tan-brown papules and/or plaques  - Benign-appearing - Discussed benign etiology and prognosis. - Observe - Call for any changes  MELANOCYTIC NEVI Exam: Tan-brown and/or pink-flesh-colored symmetric macules and papules  Treatment Plan: Benign appearing on exam today. Recommend observation. Call clinic for new or changing moles. Recommend daily use of broad spectrum spf 30+ sunscreen to sun-exposed areas.   HISTORY OF BASAL CELL CARCINOMA OF THE SKIN - nose, treated many years ago, patient unsure where it was treated - No evidence of recurrence today - Recommend regular full body skin exams - Recommend daily broad spectrum sunscreen SPF 30+ to sun-exposed areas, reapply every 2 hours as needed.  - Call if any new or changing lesions are noted between office visits  Basal cell carcinoma (BCC), unspecified site L posterior auricular  Previous bx by Dr. Jorja Loa on 09/05/20, but patient's husband became sick and she was unable to schedule Mohs procedure. Recommend referral to Dr. Jeannine Boga for Mohs.   Neoplasm of uncertain behavior of skin (2) Forehead  Skin / nail biopsy Type of biopsy: tangential   Informed consent: discussed and consent obtained   Timeout: patient name, date of birth, surgical site, and procedure verified   Procedure prep:  Patient was prepped and draped in usual sterile fashion Prep type:  Isopropyl alcohol Anesthesia: the lesion was anesthetized in a standard fashion   Anesthetic:  1% lidocaine w/ epinephrine 1-100,000 buffered w/ 8.4% NaHCO3 Instrument used: flexible razor blade   Hemostasis achieved with: pressure, aluminum chloride and electrodesiccation   Outcome:  patient tolerated procedure well   Post-procedure details: sterile dressing applied and wound care instructions given   Dressing type: bandage and petrolatum    R neck  Skin / nail biopsy Type of biopsy: tangential   Informed consent: discussed and consent obtained    Timeout: patient name, date of birth, surgical site, and procedure verified   Procedure prep:  Patient was prepped and draped in usual sterile fashion Prep type:  Isopropyl alcohol Anesthesia: the lesion was anesthetized in a standard fashion   Anesthetic:  1% lidocaine w/ epinephrine 1-100,000 buffered w/ 8.4% NaHCO3 Instrument used: flexible razor blade   Hemostasis achieved with: pressure, aluminum chloride and electrodesiccation   Outcome: patient tolerated procedure well   Post-procedure details: sterile dressing applied and wound care instructions given   Dressing type: bandage and petrolatum    Recommend Mohs if biopsies positive for BCC.  AK (actinic keratosis) (4) L neck x 2, forehead x 1, R temple x 1,    Destruction of lesion - L neck x 2, forehead x 1, R temple x 1, (4) Complexity: simple   Destruction method: cryotherapy   Informed consent: discussed and consent obtained   Timeout:  patient name, date of birth, surgical site, and procedure verified Lesion destroyed using liquid nitrogen: Yes   Region frozen until ice ball extended beyond lesion: Yes   Outcome: patient tolerated procedure well with no complications   Post-procedure details: wound care instructions given    HEMANGIOMA Exam: red papule(s) Discussed benign nature. Recommend observation. Call for changes.   Return in about 6 months (around 02/11/2023) for TBSE.  Maylene Roes, CMA, am acting as scribe for [employer], DO .  Documentation: I have reviewed the above documentation for accuracy and completeness, and I agree with the above.  Langston Reusing, DO

## 2022-08-11 NOTE — Patient Instructions (Addendum)
Thank you for visiting our office today. I appreciate your commitment to addressing your dermatological health and am glad we could continue the care initiated by Dr. Joylene Igo.  Here is a summary of the key points from today's visit:  - Mohs Surgery Referral: We will refer you to Dr. Jeannine Boga for Mohs surgery for the basal cell carcinoma behind your left ear. His office will contact you to coordinate the appointment.  - Biopsies Performed: Today, we performed shave biopsies on a spot on your right temple and two spots on your left neck due to their suspicious appearance. We will contact you with the results.  - Cryotherapy Treatment: We treated several actinic keratoses on your left neck and forehead using cryotherapy.  - Post-Treatment Care: Please apply Vaseline to the biopsied and treated areas to aid in healing.  - Follow-Up: We will see you in six months for a follow-up appointment. If the biopsy results indicate skin cancer, we will discuss further treatment options.  - Photography and Documentation: Updated photos of the affected areas were taken today for your medical record.  Please follow the post-procedure care instructions carefully, and do not hesitate to contact us if you have any questions or concerns in the meantime.      Patient Handout: Wound Care for Skin Biopsy Site  Taking Care of Your Skin Biopsy Site  Proper care of the biopsy site is essential for promoting healing and minimizing scarring. This handout provides instructions on how to care for your biopsy site to ensure optimal recovery.  1. Cleaning the Wound:  Clean the biopsy site daily with gentle soap and water. Gently pat the area dry with a clean, soft towel. Avoid harsh scrubbing or rubbing the area, as this can irritate the skin and delay healing.  2. Applying Aquaphor and Bandage:  After cleaning the wound, apply a thin layer of Aquaphor ointment to the biopsy site. Cover the area with a sterile  bandage to protect it from dirt, bacteria, and friction. Change the bandage daily or as needed if it becomes soiled or wet.  3. Continued Care for One Week:  Repeat the cleaning, Aquaphor application, and bandaging process daily for one week following the biopsy procedure. Keeping the wound clean and moist during this initial healing period will help prevent infection and promote optimal healing.  4. Massaging Aquaphor into the Area:  ---After one week, discontinue the use of bandages but continue to apply Aquaphor to the biopsy site. ----Gently massage the Aquaphor into the area using circular motions. ---Massaging the skin helps to promote circulation and prevent the formation of scar tissue.   Additional Tips:  Avoid exposing the biopsy site to direct sunlight during the healing process, as this can cause hyperpigmentation or worsen scarring. If you experience any signs of infection, such as increased redness, swelling, warmth, or drainage from the wound, contact your healthcare provider immediately. Follow any additional instructions provided by your healthcare provider for caring for the biopsy site and managing any discomfort. Conclusion:  Taking proper care of your skin biopsy site is crucial for ensuring optimal healing and minimizing scarring. By following these instructions for cleaning, applying Aquaphor, and massaging the area, you can promote a smooth and successful recovery. If you have any questions or concerns about caring for your biopsy site, don't hesitate to contact your healthcare provider for guidance.     Due to recent changes in healthcare laws, you may see results of your pathology and/or laboratory studies on MyChart  before the doctors have had a chance to review them. We understand that in some cases there may be results that are confusing or concerning to you. Please understand that not all results are received at the same time and often the doctors may need to  interpret multiple results in order to provide you with the best plan of care or course of treatment. Therefore, we ask that you please give [place] 2 business days to thoroughly review all your results before contacting the office for clarification. Should we see a critical lab result, you will be contacted sooner.   If You Need Anything After Your Visit  If you have any questions or concerns for your doctor, please call our main line at [phone] If no one answers, please leave a voicemail as directed and we will return your call as soon as possible. Messages left after 4 pm will be answered the following business day.   You may also send [place] a message via MyChart. We typically respond to MyChart messages within 1-2 business days.  For prescription refills, please ask your pharmacy to contact our office. Our fax number is [phone].  If you have an urgent issue when the clinic is closed that cannot wait until the next business day, you can page your doctor at the number below.    Please note that while we do our best to be available for urgent issues outside of office hours, we are not available 24/7.   If you have an urgent issue and are unable to reach [place], you may choose to seek medical care at your doctor's office, retail clinic, urgent care center, or emergency room.  If you have a medical emergency, please immediately call 911 or go to the emergency department. In the event of inclement weather, please call our main line at [phone] for an update on the status of any delays or closures.  Dermatology Medication Tips: Please keep the boxes that topical medications come in in order to help keep track of the instructions about where and how to use these. Pharmacies typically print the medication instructions only on the boxes and not directly on the medication tubes.   If your medication is too expensive, please contact our office at [phone] or send [place] a message through MyChart.    We are unable to tell what your co-pay for medications will be in advance as this is different depending on your insurance coverage. However, we may be able to find a substitute medication at lower cost or fill out paperwork to get insurance to cover a needed medication.   If a prior authorization is required to get your medication covered by your insurance company, please allow [place] 1-2 business days to complete this process.  Drug prices often vary depending on where the prescription is filled and some pharmacies may offer cheaper prices.  The website www.goodrx.com contains coupons for medications through different pharmacies. The prices here do not account for what the cost may be with help from insurance (it may be cheaper with your insurance), but the website can give you the price if you did not use any insurance.  - You can print the associated coupon and take it with your prescription to the pharmacy.  - You may also stop by our office during regular business hours and pick up a GoodRx coupon card.  - If you need your prescription sent electronically to a different pharmacy, notify our office through [facility] or by phone at  [phone]     

## 2022-08-13 ENCOUNTER — Encounter: Payer: Self-pay | Admitting: Dermatology

## 2022-08-13 ENCOUNTER — Telehealth: Payer: Self-pay

## 2022-08-13 DIAGNOSIS — C44319 Basal cell carcinoma of skin of other parts of face: Secondary | ICD-10-CM

## 2022-08-13 NOTE — Telephone Encounter (Signed)
-----   Message from Langston Reusing sent at 08/13/2022 11:33 AM EDT ----- Please call patient and notify that results of biopsy were positive for a skin cancer that needs to be treated with Mohs Surgery.   Diagnosis 1. Skin , forehead BASAL CELL CARCINOMA, SUPERFICIAL AND NODULAR PATTERNS --> Mohs  2. Skin , R neck BASAL CELL CARCINOMA, SUPERFICIAL AND NODULAR PATTERNS --> Mohs   We will refer to Dr. Jeannine Boga at [facility]  [facility] [address] [place], [address] [place], [place] [address]  Phone: [phone] Fax: [phone]

## 2022-08-13 NOTE — Progress Notes (Signed)
Please call patient and notify that results of biopsy were positive for a skin cancer that needs to be treated with Mohs Surgery.   Diagnosis 1. Skin , forehead BASAL CELL CARCINOMA, SUPERFICIAL AND NODULAR PATTERNS --> Mohs  2. Skin , R neck BASAL CELL CARCINOMA, SUPERFICIAL AND NODULAR PATTERNS --> Mohs   We will refer to Dr. Jeannine Boga at [facility]  [facility] [address] [place], [address] [place], [place] [address]  Phone: [phone] Fax: [phone]

## 2022-08-13 NOTE — Telephone Encounter (Signed)
Spoke with pt and gave her bx results. I told her we'd refer her to Dr. Jeannine Boga for Mohs surgery for both spots and that they would contact her to sched. Pt acknowledged understanding of plan

## 2022-08-27 DIAGNOSIS — C4441 Basal cell carcinoma of skin of scalp and neck: Secondary | ICD-10-CM | POA: Diagnosis not present

## 2022-08-27 DIAGNOSIS — C44319 Basal cell carcinoma of skin of other parts of face: Secondary | ICD-10-CM | POA: Diagnosis not present

## 2022-09-19 DIAGNOSIS — C4441 Basal cell carcinoma of skin of scalp and neck: Secondary | ICD-10-CM | POA: Diagnosis not present

## 2022-10-03 DIAGNOSIS — C44319 Basal cell carcinoma of skin of other parts of face: Secondary | ICD-10-CM | POA: Diagnosis not present

## 2022-10-29 DIAGNOSIS — D485 Neoplasm of uncertain behavior of skin: Secondary | ICD-10-CM | POA: Diagnosis not present

## 2022-10-29 DIAGNOSIS — C44219 Basal cell carcinoma of skin of left ear and external auricular canal: Secondary | ICD-10-CM | POA: Diagnosis not present

## 2022-11-13 ENCOUNTER — Telehealth: Payer: Self-pay

## 2022-11-13 NOTE — Telephone Encounter (Signed)
Left message on husband's cell phone and on her son's phone for patient to call office to schedule an appointment with Dr Caralyn Guile for untreated skin cancer of left postauricular area and for a biopsy of area of right sup lat neck that was seen by Dr Thad Ranger

## 2022-11-18 ENCOUNTER — Other Ambulatory Visit: Payer: Self-pay

## 2022-11-18 DIAGNOSIS — C44219 Basal cell carcinoma of skin of left ear and external auricular canal: Secondary | ICD-10-CM

## 2022-11-25 ENCOUNTER — Telehealth: Payer: Self-pay

## 2022-11-25 NOTE — Telephone Encounter (Signed)
Rachel Henry spoke with the patient regarding scheduling an appointment with Dr Caralyn Guile. She stated that she has another appointment scheduled with Dr Jeannine Boga and she does not want to schedule any more appointments at this time because she has been cut on enough.

## 2022-11-25 NOTE — Telephone Encounter (Signed)
Ok.  From our recent discussion regarding pt's call back it looks like Dr. Jeannine Boga treated all three skin cancers.  We should see her in 6 months for an office visit.

## 2022-12-02 DIAGNOSIS — L928 Other granulomatous disorders of the skin and subcutaneous tissue: Secondary | ICD-10-CM | POA: Diagnosis not present

## 2022-12-02 DIAGNOSIS — Z48817 Encounter for surgical aftercare following surgery on the skin and subcutaneous tissue: Secondary | ICD-10-CM | POA: Diagnosis not present

## 2023-01-06 DIAGNOSIS — Z48817 Encounter for surgical aftercare following surgery on the skin and subcutaneous tissue: Secondary | ICD-10-CM | POA: Diagnosis not present

## 2023-02-11 ENCOUNTER — Ambulatory Visit: Payer: Medicare PPO | Admitting: Dermatology

## 2023-02-11 ENCOUNTER — Encounter: Payer: Self-pay | Admitting: Dermatology

## 2023-02-11 VITALS — BP 157/95

## 2023-02-11 DIAGNOSIS — D485 Neoplasm of uncertain behavior of skin: Secondary | ICD-10-CM

## 2023-02-11 DIAGNOSIS — C44612 Basal cell carcinoma of skin of right upper limb, including shoulder: Secondary | ICD-10-CM | POA: Diagnosis not present

## 2023-02-11 DIAGNOSIS — L821 Other seborrheic keratosis: Secondary | ICD-10-CM | POA: Diagnosis not present

## 2023-02-11 DIAGNOSIS — W908XXA Exposure to other nonionizing radiation, initial encounter: Secondary | ICD-10-CM | POA: Diagnosis not present

## 2023-02-11 DIAGNOSIS — Z1283 Encounter for screening for malignant neoplasm of skin: Secondary | ICD-10-CM | POA: Diagnosis not present

## 2023-02-11 DIAGNOSIS — D492 Neoplasm of unspecified behavior of bone, soft tissue, and skin: Secondary | ICD-10-CM

## 2023-02-11 DIAGNOSIS — L578 Other skin changes due to chronic exposure to nonionizing radiation: Secondary | ICD-10-CM

## 2023-02-11 DIAGNOSIS — D1801 Hemangioma of skin and subcutaneous tissue: Secondary | ICD-10-CM | POA: Diagnosis not present

## 2023-02-11 DIAGNOSIS — L814 Other melanin hyperpigmentation: Secondary | ICD-10-CM

## 2023-02-11 DIAGNOSIS — L570 Actinic keratosis: Secondary | ICD-10-CM | POA: Diagnosis not present

## 2023-02-11 DIAGNOSIS — D229 Melanocytic nevi, unspecified: Secondary | ICD-10-CM

## 2023-02-11 DIAGNOSIS — Z85828 Personal history of other malignant neoplasm of skin: Secondary | ICD-10-CM

## 2023-02-11 DIAGNOSIS — C44619 Basal cell carcinoma of skin of left upper limb, including shoulder: Secondary | ICD-10-CM | POA: Diagnosis not present

## 2023-02-11 DIAGNOSIS — C4491 Basal cell carcinoma of skin, unspecified: Secondary | ICD-10-CM

## 2023-02-11 NOTE — Progress Notes (Signed)
New Patient Visit   Subjective  Rachel Henry is a 89 y.o. female who presents for the following:  Total Body Skin Exam (TBSE)  Patient present today for new patient visit for TBSE.The patient reports she has spots, moles and lesions to be evaluated, some may be new or changing and the patient may have concern these could be cancer. Patient has previously been treated by a dermatologist.Patient reports she has hx of bx that proven BCC. Patient reports family history of skin cancers. Patient reports throughout her lifetime has had minimal sun exposure. Currently, patient reports if she has excessive sun exposure, she does not apply sunscreen and/or wears protective coverings.  The following portions of the chart were reviewed this encounter and updated as appropriate: medications, allergies, medical history  Review of Systems:  No other skin or systemic complaints except as noted in HPI or Assessment and Plan.  Objective  Well appearing patient in no apparent distress; mood and affect are within normal limits.  A full examination was performed including scalp, head, eyes, ears, nose, lips, neck, chest, axillae, abdomen, back, buttocks, bilateral upper extremities, bilateral lower extremities, hands, feet, fingers, toes, fingernails, and toenails. All findings within normal limits unless otherwise noted below.     Relevant exam findings are noted in the Assessment and Plan.  Left clavicle 2cm pink plaque with blood bessel changes  Right Forearm - Anterior 7mm pink papule   Assessment & Plan   LENTIGINES, SEBORRHEIC KERATOSES, HEMANGIOMAS - Benign normal skin lesions - Benign-appearing - Call for any changes  BENIGN MELANOCYTIC NEVI - Tan-brown and/or pink-flesh-colored symmetric macules and papules - Benign appearing on exam today - Observation - Call clinic for new or changing moles - Recommend daily use of broad spectrum spf 30+ sunscreen to sun-exposed areas.   MILD  ACTINIC DAMAGE - Chronic condition, secondary to cumulative UV/sun exposure - diffuse scaly erythematous macules with underlying dyspigmentation - Recommend daily broad spectrum sunscreen SPF 30+ to sun-exposed areas, reapply every 2 hours as needed.  - Staying in the shade or wearing long sleeves, sun glasses (UVA+UVB protection) and wide brim hats (4-inch brim around the entire circumference of the hat) are also recommended for sun protection.  - Call for new or changing lesions.  HISTORY OF BASAL CELL CARCINOMA OF THE SKIN - No evidence of recurrence today - Recommend regular full body skin exams - Recommend daily broad spectrum sunscreen SPF 30+ to sun-exposed areas, reapply every 2 hours as needed.  - Call if any new or changing lesions are noted between office visits    SKIN CANCER SCREENING PERFORMED TODAY NEOPLASM OF UNCERTAIN BEHAVIOR OF SKIN (2) Left clavicle Skin / nail biopsy Type of biopsy: tangential   Informed consent: discussed and consent obtained   Timeout: patient name, date of birth, surgical site, and procedure verified   Procedure prep:  Patient was prepped and draped in usual sterile fashion Prep type:  Isopropyl alcohol Anesthesia: the lesion was anesthetized in a standard fashion   Anesthetic:  1% lidocaine w/ epinephrine 1-100,000 buffered w/ 8.4% NaHCO3 Instrument used: DermaBlade   Hemostasis achieved with: aluminum chloride   Outcome: patient tolerated procedure well   Post-procedure details: sterile dressing applied and wound care instructions given   Dressing type: petrolatum gauze and bandage   Right Forearm - Anterior Skin / nail biopsy Type of biopsy: tangential   Informed consent: discussed and consent obtained   Timeout: patient name, date of birth, surgical site, and procedure  verified   Procedure prep:  Patient was prepped and draped in usual sterile fashion Prep type:  Isopropyl alcohol Anesthesia: the lesion was anesthetized in a standard  fashion   Anesthetic:  1% lidocaine w/ epinephrine 1-100,000 buffered w/ 8.4% NaHCO3 Instrument used: DermaBlade   Hemostasis achieved with: aluminum chloride   Outcome: patient tolerated procedure well   Post-procedure details: sterile dressing applied and wound care instructions given   Dressing type: petrolatum gauze and bandage   AK (ACTINIC KERATOSIS) Right Upper Back Destruction of lesion - Right Upper Back Complexity: simple   Destruction method: cryotherapy   Informed consent: discussed and consent obtained   Timeout:  patient name, date of birth, surgical site, and procedure verified Lesion destroyed using liquid nitrogen: Yes   Region frozen until ice ball extended beyond lesion: Yes   Outcome: patient tolerated procedure well with no complications   Post-procedure details: wound care instructions given    No follow-ups on file.   Documentation: I have reviewed the above documentation for accuracy and completeness, and I agree with the above.   I, Shirron Marcha Solders, CMA, am acting as scribe for [employer], DO.   Langston Reusing, DO

## 2023-02-11 NOTE — Patient Instructions (Addendum)

## 2023-02-12 LAB — SURGICAL PATHOLOGY

## 2023-02-17 ENCOUNTER — Encounter: Payer: Self-pay | Admitting: Dermatology

## 2023-02-17 ENCOUNTER — Telehealth: Payer: Self-pay

## 2023-02-17 DIAGNOSIS — C4491 Basal cell carcinoma of skin, unspecified: Secondary | ICD-10-CM | POA: Insufficient documentation

## 2023-02-17 NOTE — Progress Notes (Signed)
Please call patient and review results and refer for Mohs with Dr Caralyn Guile for positive skin cancer(s)  Thanks!  Diagnosis 1. Skin , left clavicle --> Mohs b/c of size and location SUPERFICIAL AND NODULAR BASAL CELL CARCINOMA, DEEP MARGIN INVOLVED  2. Skin , right forearm - anterior --> Mohs b/c BCC is infiltrative  BASAL CELL CARCINOMA, NODULAR AND INFILTRATIVE PATTERNS, DEEP MARGIN INVOLVED

## 2023-02-17 NOTE — Telephone Encounter (Signed)
-----   Message from Langston Reusing sent at 02/17/2023  8:33 AM EST ----- Please call patient and review results and refer for Mohs with Dr Caralyn Guile for positive skin cancer(s)  Thanks!  Diagnosis 1. Skin , left clavicle --> Mohs b/c of size and location SUPERFICIAL AND NODULAR BASAL CELL CARCINOMA, DEEP MARGIN INVOLVED  2. Skin , right forearm - anterior --> Mohs b/c BCC is infiltrative  BASAL CELL CARCINOMA, NODULAR AND INFILTRATIVE PATTERNS, DEEP MARGIN INVOLVED

## 2023-02-17 NOTE — Telephone Encounter (Signed)
Left msg with pt son - pt to call back to discuss

## 2023-03-04 ENCOUNTER — Telehealth: Payer: Self-pay | Admitting: Dermatology

## 2023-03-04 NOTE — Telephone Encounter (Signed)
-----   Message from [place] P sent at 03/03/2023  4:10 PM EST ----- Regarding: Result call again Reached out to patient son in regards to scheduling patient for [facility] but he advised that he never received a call about the biopsy results. So before scheduling the surgery he would like to be called again to go over  his mother results. Please if you could call him again so we can schedule. Best number to reach him is  [phone].  Thank you

## 2023-03-04 NOTE — Telephone Encounter (Signed)
I called and went over biopsy results and he was ready for scheduling

## 2023-03-30 ENCOUNTER — Encounter: Payer: Self-pay | Admitting: Dermatology

## 2023-03-31 ENCOUNTER — Encounter: Payer: Self-pay | Admitting: Dermatology

## 2023-03-31 ENCOUNTER — Ambulatory Visit: Payer: Medicare PPO | Admitting: Dermatology

## 2023-03-31 VITALS — BP 68/49 | HR 59 | Temp 98.0°F

## 2023-03-31 DIAGNOSIS — L579 Skin changes due to chronic exposure to nonionizing radiation, unspecified: Secondary | ICD-10-CM | POA: Diagnosis not present

## 2023-03-31 DIAGNOSIS — C44519 Basal cell carcinoma of skin of other part of trunk: Secondary | ICD-10-CM | POA: Diagnosis not present

## 2023-03-31 DIAGNOSIS — L814 Other melanin hyperpigmentation: Secondary | ICD-10-CM

## 2023-03-31 DIAGNOSIS — C4491 Basal cell carcinoma of skin, unspecified: Secondary | ICD-10-CM

## 2023-03-31 NOTE — Patient Instructions (Signed)

## 2023-03-31 NOTE — Progress Notes (Signed)
 Follow-Up Visit   Subjective  Rachel Henry is a 89 y.o. female who presents for the following: Mohs of a superficial and nodular Basal Cell Carcinoma of the left clavicle, biopsied by Dr. Onalee Hua. Patient is accompanied by her son.  The following portions of the chart were reviewed this encounter and updated as appropriate: medications, allergies, medical history  Review of Systems:  No other skin or systemic complaints except as noted in HPI or Assessment and Plan.  Objective  Well appearing patient in no apparent distress; mood and affect are within normal limits.  A focused examination was performed of the following areas: Left clavicle Relevant physical exam findings are noted in the Assessment and Plan.   Left clavicle Pink pearly papule or plaque with arborizing vessels.    Assessment & Plan   BASAL CELL CARCINOMA (BCC), UNSPECIFIED SITE Left clavicle Mohs surgery  Consent obtained: written  Anticoagulation: Is the patient taking prescription anticoagulant and/or aspirin prescribed/recommended by a physician? No   Was the anticoagulation regimen changed prior to Mohs? No    Procedure Details: Timeout: pre-procedure verification complete Procedure Prep: patient was prepped and draped in usual sterile fashion Prep type: chlorhexidine Biopsy accession number: [id number] Biopsy lab: [facility] Frozen section biopsy performed: No   Specimen debulked: No   Pre-Op diagnosis: basal cell carcinoma BCC subtype: nodular and superficial MohsAIQ Surgical site (if tumor spans multiple areas, please select predominant area): trunk (excluding nipple/areola) Surgery side: left Surgical site (from skin exam): Left clavicle Pre-operative length (cm): 2.8 Pre-operative width (cm): 1 Indications for Mohs surgery: anatomic location where tissue conservation is critical Previously treated? No    Micrographic Surgery Details: Post-operative length (cm): 4 Post-operative width  (cm): 1.8 Number of Mohs stages: 1 Is this a complex case (associate members only): No    Stage 1    Tumor features identified on Mohs section: no tumor identified    Depth of defect after stage: subcutaneous fat    Perineural invasion: no perineural invasion  Patient tolerance of procedure: tolerated well, no immediate complications  Reconstruction: Was the defect reconstructed? Yes   Was reconstruction performed by the same Mohs surgeon? Yes   Setting of reconstruction: outpatient office When was reconstruction performed? same day Type of reconstruction: linear Linear reconstruction: complex  Opioids: Did the patient receive a prescription for opioid/narcotic related to Mohs surgery?: No    Antibiotics: Does patient meet AHA guidelines for endocarditis?: No   Does patient meet AHA guidelines for orthopedic prophylaxis?: No   Were antibiotics given on the day of surgery?: No   Did surgery breach mucosa, expose cartilage/bone, involve an area of lymphedema/inflamed/infected tissue? No    Skin repair Complexity:  Complex Final length (cm):  6.8 Informed consent: discussed and consent obtained   Timeout: patient name, date of birth, surgical site, and procedure verified   Procedure prep:  Patient was prepped and draped in usual sterile fashion Prep type:  Chlorhexidine Anesthesia: the lesion was anesthetized in a standard fashion   Anesthetic:  1% lidocaine w/ epinephrine 1-100,000 buffered w/ 8.4% NaHCO3 Reason for type of repair: reduce tension to allow closure and preserve normal anatomy   Undermining: edges undermined   Subcutaneous layers (deep stitches):  Suture size:  4-0 Suture type: Vicryl (polyglactin 910)   Stitches:  Buried vertical mattress Fine/surface layer approximation (top stitches):  Suture type: cyanoacrylate tissue glue   Hemostasis achieved with: suture, pressure and electrodesiccation Outcome: patient tolerated procedure well with no complications  Post-procedure details: sterile dressing applied and wound care instructions given   Dressing type: bandage and pressure dressing    Return in about 4 weeks (around 04/28/2023).  Cassandria Santee, Surg Tech III, am acting as scribe for Gwenith Daily, MD.    03/31/2023  HISTORY OF PRESENT ILLNESS  Rachel Henry is seen in consultation at the request of Dr. Onalee Hua for biopsy-proven Superficial and Nodular Basal Cell Carcinoma on the left shoulder/clavicle. They note that the area has been present for about 1 year increasing in size with time.  There is no history of previous treatment.  Reports no other new or changing lesions and has no other complaints today.  Medications and allergies: see patient chart.  Review of systems: Reviewed 8 systems and notable for the above skin cancer.  All other systems reviewed are unremarkable/negative, unless noted in the HPI. Past medical history, surgical history, family history, social history were also reviewed and are noted in the chart/questionnaire.    PHYSICAL EXAMINATION  General: Well-appearing, in no acute distress, alert and oriented x 4. Vitals reviewed in chart (if available).   Skin: Exam reveals a 2.8 x 1.0 cm erythematous papule and biopsy scar on the left clavicle. There are rhytids, telangiectasias, and lentigines, consistent with photodamage.  Biopsy report(s) reviewed, confirming the diagnosis.   ASSESSMENT  1) Superficial and Nodular Basal Cell Carcinoma 2) photodamage 3) solar lentigines   PLAN   1. Due to location, size, histology, or recurrence and the likelihood of subclinical extension as well as the need to conserve normal surrounding tissue, the patient was deemed acceptable for Mohs micrographic surgery (MMS).  The nature and purpose of the procedure, associated benefits and risks including recurrence and scarring, possible complications such as pain, infection, and bleeding, and alternative methods of treatment if  appropriate were discussed with the patient during consent. The lesion location was verified by the patient, by reviewing previous notes, pathology reports, and by photographs as well as angulation measurements if available.  Informed consent was reviewed and signed by the patient, and timeout was performed at 10:00 AM. See op note below.  2. For the photodamage and solar lentigines, sun protection discussed/information given on OTC sunscreens, and we recommend continued regular follow-up with primary dermatologist every 6 months or sooner for any growing, bleeding, or changing lesions. 3. Prognosis and future surveillance discussed. 4. Letter with treatment outcome sent to referring provider. 5. Pain acetaminophen/ibuprofen   MOHS MICROGRAPHIC SURGERY AND RECONSTRUCTION  Initial size:   2.8 x 1.0 cm Surgical defect/wound size: 4.0 x 1.8 cm Anesthesia:    0.33% lidocaine with 1:200,000 epinephrine EBL:    <5 mL Complications:  None Repair type:   Complex SQ suture:   4-0 PDS Cutaneous suture:  Dermabond and Steri Strips Final size of the repair: 6.8 cm  Stages: 1  STAGE I: Anesthesia achieved with 0.5% lidocaine with 1:200,000 epinephrine. ChloraPrep applied. 1 section(s) excised using Mohs technique (this includes total peripheral and deep tissue margin excision and evaluation with frozen sections, excised and interpreted by the same physician). The tumor was first debulked and then excised with an approx. 2mm margin.  Hemostasis was achieved with electrocautery as needed.  The specimen was then oriented, subdivided/relaxed, inked, and processed using Mohs technique.    Frozen section analysis revealed a clear deep and peripheral margin.  Reconstruction  The surgical wound was then cleaned, prepped, and re-anesthetized as above. Wound edges were undermined extensively along at least one entire  edge and at a distance equal to or greater than the width of the defect (see wound defect size  above) in order to achieve closure and decrease wound tension and anatomic distortion. Redundant tissue repair including standing cone removal was performed. Hemostasis was achieved with electrocautery. Subcutaneous and epidermal tissues were approximated with the above sutures. The surgical site was then lightly scrubbed with sterile, saline-soaked gauze. Steri-strips were applied, and the area was then bandaged using Vaseline ointment, non-adherent gauze, gauze pads, and tape to provide an adequate pressure dressing. The patient tolerated the procedure well, was given detailed written and verbal wound care instructions, and was discharged in good condition.   The patient will follow-up: 4 weeks.    Documentation: I have reviewed the above documentation for accuracy and completeness, and I agree with the above.  Gwenith Daily, MD

## 2023-04-02 ENCOUNTER — Encounter: Payer: Self-pay | Admitting: Dermatology

## 2023-04-13 ENCOUNTER — Encounter: Payer: Self-pay | Admitting: Dermatology

## 2023-04-14 ENCOUNTER — Encounter: Payer: Self-pay | Admitting: Dermatology

## 2023-04-14 ENCOUNTER — Ambulatory Visit (INDEPENDENT_AMBULATORY_CARE_PROVIDER_SITE_OTHER): Payer: Medicare PPO | Admitting: Dermatology

## 2023-04-14 VITALS — BP 138/67 | HR 72

## 2023-04-14 DIAGNOSIS — C4491 Basal cell carcinoma of skin, unspecified: Secondary | ICD-10-CM

## 2023-04-14 DIAGNOSIS — Z85828 Personal history of other malignant neoplasm of skin: Secondary | ICD-10-CM | POA: Diagnosis not present

## 2023-04-14 DIAGNOSIS — L579 Skin changes due to chronic exposure to nonionizing radiation, unspecified: Secondary | ICD-10-CM

## 2023-04-14 DIAGNOSIS — C44612 Basal cell carcinoma of skin of right upper limb, including shoulder: Secondary | ICD-10-CM | POA: Diagnosis not present

## 2023-04-14 DIAGNOSIS — T1490XD Injury, unspecified, subsequent encounter: Secondary | ICD-10-CM

## 2023-04-14 DIAGNOSIS — L905 Scar conditions and fibrosis of skin: Secondary | ICD-10-CM | POA: Diagnosis not present

## 2023-04-14 DIAGNOSIS — L814 Other melanin hyperpigmentation: Secondary | ICD-10-CM

## 2023-04-14 NOTE — Progress Notes (Signed)
 Follow-Up Visit   Subjective  Rachel Henry is a 89 y.o. female who presents for the following: Mohs of a Nodular and Infiltrative Basal Cell Carcinoma of the right forearm-anterior, biopsied by Dr. Onalee Hua.  She is s/p Mohs for a Superficial/Nodular BCC on the left clavicle, treated on 03/31/2023, repaired with linear closure, healing well.  The following portions of the chart were reviewed this encounter and updated as appropriate: medications, allergies, medical history  Review of Systems:  No other skin or systemic complaints except as noted in HPI or Assessment and Plan.  Objective  Well appearing patient in no apparent distress; mood and affect are within normal limits.  A focused examination was performed of the following areas: Right forearm-anterior Left shoulder Relevant physical exam findings are noted in the Assessment and Plan.   Right Forearm - Anterior Pink pearly papule or plaque with arborizing vessels.    Assessment & Plan   BASAL CELL CARCINOMA (BCC), UNSPECIFIED SITE Right Forearm - Anterior Mohs surgery  Consent obtained: written  Anticoagulation: Is the patient taking prescription anticoagulant and/or aspirin prescribed/recommended by a physician? No   Was the anticoagulation regimen changed prior to Mohs? No    Anesthesia: Anesthesia method: local infiltration Local anesthetic: lidocaine 1% WITH epi  Procedure Details: Timeout: pre-procedure verification complete Procedure Prep: patient was prepped and draped in usual sterile fashion Prep type: chlorhexidine Biopsy accession number: [id number] Biopsy lab: [facility] Frozen section biopsy performed: No   Specimen debulked: No   Pre-Op diagnosis: basal cell carcinoma BCC subtype: nodular and infiltrative MohsAIQ Surgical site (if tumor spans multiple areas, please select predominant area): upper extremity Surgery side: right Surgical site (from skin exam): Right Forearm -  Anterior Pre-operative length (cm): 1.6 Pre-operative width (cm): 0.6 Indications for Mohs surgery: anatomic location where tissue conservation is critical Previously treated? No    Micrographic Surgery Details: Post-operative length (cm): 2.2 Post-operative width (cm): 2 Number of Mohs stages: 1 Is this a complex case (associate members only): No    Stage 1    Tumor features identified on Mohs section: no tumor identified    Depth of defect after stage: subcutaneous fat    Perineural invasion: no perineural invasion  Patient tolerance of procedure: tolerated well, no immediate complications  Reconstruction: Was the defect reconstructed? Yes   Was reconstruction performed by the same Mohs surgeon? Yes   Setting of reconstruction: outpatient office When was reconstruction performed? same day Type of reconstruction: linear Linear reconstruction: complex Length of linear repair (cm): 7  Opioids: Did the patient receive a prescription for opioid/narcotic related to Mohs surgery?: No    Antibiotics: Does patient meet AHA guidelines for endocarditis?: No   Does patient meet AHA guidelines for orthopedic prophylaxis?: No   Were antibiotics given on the day of surgery?: No   Did surgery breach mucosa, expose cartilage/bone, involve an area of lymphedema/inflamed/infected tissue? No    Skin repair Complexity:  Complex Final length (cm):  7.3 Informed consent: discussed and consent obtained   Timeout: patient name, date of birth, surgical site, and procedure verified   Procedure prep:  Patient was prepped and draped in usual sterile fashion Prep type:  Chlorhexidine Anesthesia: the lesion was anesthetized in a standard fashion   Anesthetic:  1% lidocaine w/ epinephrine 1-100,000 buffered w/ 8.4% NaHCO3 Reason for type of repair: reduce tension to allow closure and preserve normal anatomy   Undermining: area extensively undermined   Subcutaneous layers (deep stitches):  Suture  size:  4-0 Suture type: Vicryl (polyglactin 910)   Stitches:  Buried vertical mattress Fine/surface layer approximation (top stitches):  Suture type: cyanoacrylate tissue glue   Hemostasis achieved with: suture, pressure and electrodesiccation Outcome: patient tolerated procedure well with no complications   Post-procedure details: sterile dressing applied and wound care instructions given   Dressing type: bandage and pressure dressing   HEALING WOUND    Scar s/p Mohs for BCC on left clavicle, treated on 03/31/2023, repaired with linear closure - Reassured that wound has healed well - Discussed that scars take up to 12 months to mature from the date of surgery - Recommend SPF 30+ to scar daily to prevent purple color - OK to start scar massage at 4-6 weeks post-op - Can consider silicone based products for scar healing  Return in about 4 weeks (around 05/12/2023) for f/u from Mohs.   04/14/2023  HISTORY OF PRESENT ILLNESS  Rachel Henry is seen in consultation at the request of Dr. Onalee Hua for biopsy-proven Nodular and Infiltrative Basal Cell Carcinoma of the right forearm. They note that the area has been present for about 6 months increasing in size with time.  There is no history of previous treatment.  Reports no other new or changing lesions and has no other complaints today.  Medications and allergies: see patient chart.  Review of systems: Reviewed 8 systems and notable for the above skin cancer.  All other systems reviewed are unremarkable/negative, unless noted in the HPI. Past medical history, surgical history, family history, social history were also reviewed and are noted in the chart/questionnaire.    PHYSICAL EXAMINATION  General: Well-appearing, in no acute distress, alert and oriented x 4. Vitals reviewed in chart (if available).   Skin: Exam reveals a 1.6 x 0.6 cm erythematous papule and biopsy scar on the right forearm. There are rhytids, telangiectasias, and  lentigines, consistent with photodamage.   Biopsy report(s) reviewed, confirming the diagnosis.   ASSESSMENT  1) Nodular and Infiltrative Basal Cell Carcinoma of the right forearm 2) photodamage 3) solar lentigines   PLAN   1. Due to location, size, histology, or recurrence and the likelihood of subclinical extension as well as the need to conserve normal surrounding tissue, the patient was deemed acceptable for Mohs micrographic surgery (MMS).  The nature and purpose of the procedure, associated benefits and risks including recurrence and scarring, possible complications such as pain, infection, and bleeding, and alternative methods of treatment if appropriate were discussed with the patient during consent. The lesion location was verified by the patient, by reviewing previous notes, pathology reports, and by photographs as well as angulation measurements if available.  Informed consent was reviewed and signed by the patient, and timeout was performed at 10:00 AM. See op note below.  2. For the photodamage and solar lentigines, sun protection discussed/information given on OTC sunscreens, and we recommend continued regular follow-up with primary dermatologist every 6 months or sooner for any growing, bleeding, or changing lesions. 3. Prognosis and future surveillance discussed. 4. Letter with treatment outcome sent to referring provider. 5. Pain acetaminophen/ibuprofen  MOHS MICROGRAPHIC SURGERY AND RECONSTRUCTION  Initial size:   1.6 x 0.6 cm Surgical defect/wound size: 2.2 x 2.0 cm Anesthesia:    0.33% lidocaine with 1:200,000 epinephrine EBL:    <5 mL Complications:  None Repair type:   Complex SQ suture:   4-0 Vicryl Cutaneous suture:  Dermabond and Steri strips Final size of the repair: 7.3 cm  Stages: 1  STAGE I: Anesthesia  achieved with 0.5% lidocaine with 1:200,000 epinephrine. ChloraPrep applied. 1 section(s) excised using Mohs technique (this includes total peripheral and  deep tissue margin excision and evaluation with frozen sections, excised and interpreted by the same physician). The tumor was first debulked and then excised with an approx. 2mm margin.  Hemostasis was achieved with electrocautery as needed.  The specimen was then oriented, subdivided/relaxed, inked, and processed using Mohs technique.    Frozen section analysis revealed a clear deep and peripheral margin.  Reconstruction  The surgical wound was then cleaned, prepped, and re-anesthetized as above. Wound edges were undermined extensively along at least one entire edge and at a distance equal to or greater than the width of the defect (see wound defect size above) in order to achieve closure and decrease wound tension and anatomic distortion. Redundant tissue repair including standing cone removal was performed. Hemostasis was achieved with electrocautery. Subcutaneous and epidermal tissues were approximated with the above sutures. The surgical site was then lightly scrubbed with sterile, saline-soaked gauze. Steri-strips were applied, and the area was then bandaged using Vaseline ointment, non-adherent gauze, gauze pads, and tape to provide an adequate pressure dressing. The patient tolerated the procedure well, was given detailed written and verbal wound care instructions, and was discharged in good condition.   The patient will follow-up: 4 weeks.   Documentation: I have reviewed the above documentation for accuracy and completeness, and I agree with the above.  Gwenith Daily, MD

## 2023-04-14 NOTE — Patient Instructions (Signed)

## 2023-04-16 ENCOUNTER — Encounter: Payer: Self-pay | Admitting: Dermatology

## 2023-04-16 DIAGNOSIS — M1990 Unspecified osteoarthritis, unspecified site: Secondary | ICD-10-CM | POA: Diagnosis not present

## 2023-04-16 DIAGNOSIS — K5190 Ulcerative colitis, unspecified, without complications: Secondary | ICD-10-CM | POA: Diagnosis not present

## 2023-04-16 DIAGNOSIS — E1122 Type 2 diabetes mellitus with diabetic chronic kidney disease: Secondary | ICD-10-CM | POA: Diagnosis not present

## 2023-04-16 DIAGNOSIS — E785 Hyperlipidemia, unspecified: Secondary | ICD-10-CM | POA: Diagnosis not present

## 2023-04-16 DIAGNOSIS — Z833 Family history of diabetes mellitus: Secondary | ICD-10-CM | POA: Diagnosis not present

## 2023-04-16 DIAGNOSIS — K219 Gastro-esophageal reflux disease without esophagitis: Secondary | ICD-10-CM | POA: Diagnosis not present

## 2023-04-16 DIAGNOSIS — H353290 Exudative age-related macular degeneration, unspecified eye, stage unspecified: Secondary | ICD-10-CM | POA: Diagnosis not present

## 2023-04-16 DIAGNOSIS — G4733 Obstructive sleep apnea (adult) (pediatric): Secondary | ICD-10-CM | POA: Diagnosis not present

## 2023-04-16 DIAGNOSIS — Z8249 Family history of ischemic heart disease and other diseases of the circulatory system: Secondary | ICD-10-CM | POA: Diagnosis not present

## 2023-04-23 DIAGNOSIS — D649 Anemia, unspecified: Secondary | ICD-10-CM | POA: Diagnosis not present

## 2023-04-23 DIAGNOSIS — Z0000 Encounter for general adult medical examination without abnormal findings: Secondary | ICD-10-CM | POA: Diagnosis not present

## 2023-04-23 DIAGNOSIS — E7800 Pure hypercholesterolemia, unspecified: Secondary | ICD-10-CM | POA: Diagnosis not present

## 2023-04-23 DIAGNOSIS — M8580 Other specified disorders of bone density and structure, unspecified site: Secondary | ICD-10-CM | POA: Diagnosis not present

## 2023-04-23 DIAGNOSIS — Z1331 Encounter for screening for depression: Secondary | ICD-10-CM | POA: Diagnosis not present

## 2023-04-23 DIAGNOSIS — E1122 Type 2 diabetes mellitus with diabetic chronic kidney disease: Secondary | ICD-10-CM | POA: Diagnosis not present

## 2023-04-23 DIAGNOSIS — K219 Gastro-esophageal reflux disease without esophagitis: Secondary | ICD-10-CM | POA: Diagnosis not present

## 2023-04-23 DIAGNOSIS — G4733 Obstructive sleep apnea (adult) (pediatric): Secondary | ICD-10-CM | POA: Diagnosis not present

## 2023-04-23 DIAGNOSIS — I129 Hypertensive chronic kidney disease with stage 1 through stage 4 chronic kidney disease, or unspecified chronic kidney disease: Secondary | ICD-10-CM | POA: Diagnosis not present

## 2023-04-23 DIAGNOSIS — D7589 Other specified diseases of blood and blood-forming organs: Secondary | ICD-10-CM | POA: Diagnosis not present

## 2023-04-23 DIAGNOSIS — Z79899 Other long term (current) drug therapy: Secondary | ICD-10-CM | POA: Diagnosis not present

## 2023-04-23 DIAGNOSIS — K5120 Ulcerative (chronic) proctitis without complications: Secondary | ICD-10-CM | POA: Diagnosis not present

## 2023-04-23 DIAGNOSIS — N1831 Chronic kidney disease, stage 3a: Secondary | ICD-10-CM | POA: Diagnosis not present

## 2023-04-23 DIAGNOSIS — Z23 Encounter for immunization: Secondary | ICD-10-CM | POA: Diagnosis not present

## 2023-04-24 ENCOUNTER — Other Ambulatory Visit: Payer: Self-pay | Admitting: Internal Medicine

## 2023-04-24 DIAGNOSIS — M8580 Other specified disorders of bone density and structure, unspecified site: Secondary | ICD-10-CM

## 2023-04-30 ENCOUNTER — Ambulatory Visit: Admitting: Dermatology

## 2023-04-30 ENCOUNTER — Encounter: Payer: Self-pay | Admitting: Dermatology

## 2023-04-30 DIAGNOSIS — T1490XD Injury, unspecified, subsequent encounter: Secondary | ICD-10-CM

## 2023-04-30 DIAGNOSIS — Z85828 Personal history of other malignant neoplasm of skin: Secondary | ICD-10-CM | POA: Diagnosis not present

## 2023-04-30 DIAGNOSIS — L539 Erythematous condition, unspecified: Secondary | ICD-10-CM | POA: Diagnosis not present

## 2023-04-30 DIAGNOSIS — L905 Scar conditions and fibrosis of skin: Secondary | ICD-10-CM | POA: Diagnosis not present

## 2023-04-30 DIAGNOSIS — C4491 Basal cell carcinoma of skin, unspecified: Secondary | ICD-10-CM

## 2023-04-30 NOTE — Progress Notes (Signed)
   Follow Up Visit   Subjective  Rachel Henry is a 89 y.o. female who presents for the following: follow up from Mohs surgery   The patient presents for follow up from Mohs surgery for a BCC on the right forearm, treated on 04/14/23, repaired with linear closure. Also, s/p Mohs for a Superficial/Nodular BCC on the left clavicle, treated on 03/31/2023, repaired with linear closure, healing well.  The patient has been bandaging the wound as directed. The endorse the following concerns: No questions or concerns at this time.  The following portions of the chart were reviewed this encounter and updated as appropriate: medications, allergies, medical history  Review of Systems:  No other skin or systemic complaints except as noted in HPI or Assessment and Plan.  Objective  Well appearing patient in no apparent distress; mood and affect are within normal limits.  A full examination was performed including scalp, head, face and right arm. All findings within normal limits unless otherwise noted below.  Healing wound with mild erythema  Relevant physical exam findings are noted in the Assessment and Plan.     Assessment & Plan   Healing s/p Mohs for [facility], treated on 04/14/23, repaired with linear repair - Reassured that wound is healing well - No evidence of infection - No swelling, induration, purulence, dehiscence, or tenderness out of proportion to the clinical exam, see photo above - Discussed that scars take up to 12 months to mature from the date of surgery - Recommend SPF 30+ to scar daily to prevent purple color from UV exposure during scar maturation process - Discussed that erythema and raised appearance of scar will fade over the next 4-6 months - OK to start scar massage at 4-6 weeks post-op - Can consider silicone based products for scar healing starting at 6 weeks post-op - Ok to discontinue ointment daily to wound.  Scar s/p Mohs for [facility] on left clavicle, treated on  03/31/2023, repaired with linear closure - Reassured that wound has healed well - Discussed that scars take up to 12 months to mature from the date of surgery - Recommend SPF 30+ to scar daily to prevent purple color - OK to start scar massage at 4-6 weeks post-op - Can consider silicone based products for scar healing  HISTORY OF BASAL CELL CARCINOMA OF THE SKIN - No evidence of recurrence today - Recommend regular full body skin exams - Recommend daily broad spectrum sunscreen SPF 30+ to sun-exposed areas, reapply every 2 hours as needed.  - Call if any new or changing lesions are noted between office visits  Return in about 1 year (around 04/29/2024) for [facility] with Dr. Onalee Hua .  I, Manual Meier, Surg Tech III, am acting as scribe for Gwenith Daily, MD.   Documentation: I have reviewed the above documentation for accuracy and completeness, and I agree with the above.  Gwenith Daily, MD

## 2023-04-30 NOTE — Patient Instructions (Signed)

## 2023-05-13 ENCOUNTER — Ambulatory Visit: Admitting: Dermatology

## 2023-05-13 DIAGNOSIS — H401122 Primary open-angle glaucoma, left eye, moderate stage: Secondary | ICD-10-CM | POA: Diagnosis not present

## 2023-05-13 DIAGNOSIS — H401113 Primary open-angle glaucoma, right eye, severe stage: Secondary | ICD-10-CM | POA: Diagnosis not present

## 2023-05-14 ENCOUNTER — Ambulatory Visit: Admitting: Dermatology

## 2023-05-28 ENCOUNTER — Other Ambulatory Visit: Payer: Self-pay | Admitting: Internal Medicine

## 2023-06-04 DIAGNOSIS — H353112 Nonexudative age-related macular degeneration, right eye, intermediate dry stage: Secondary | ICD-10-CM | POA: Diagnosis not present

## 2023-06-04 DIAGNOSIS — H401113 Primary open-angle glaucoma, right eye, severe stage: Secondary | ICD-10-CM | POA: Diagnosis not present

## 2023-06-04 DIAGNOSIS — H401122 Primary open-angle glaucoma, left eye, moderate stage: Secondary | ICD-10-CM | POA: Diagnosis not present

## 2023-06-04 DIAGNOSIS — H353221 Exudative age-related macular degeneration, left eye, with active choroidal neovascularization: Secondary | ICD-10-CM | POA: Diagnosis not present

## 2023-06-18 ENCOUNTER — Encounter: Payer: Self-pay | Admitting: Dermatology

## 2023-06-24 ENCOUNTER — Encounter: Payer: Self-pay | Admitting: Dermatology

## 2023-07-21 ENCOUNTER — Encounter: Payer: Self-pay | Admitting: Internal Medicine

## 2023-07-21 ENCOUNTER — Ambulatory Visit: Admitting: Internal Medicine

## 2023-07-21 VITALS — BP 124/54 | HR 72 | Ht 60.0 in | Wt 135.5 lb

## 2023-07-21 DIAGNOSIS — K219 Gastro-esophageal reflux disease without esophagitis: Secondary | ICD-10-CM | POA: Diagnosis not present

## 2023-07-21 DIAGNOSIS — K625 Hemorrhage of anus and rectum: Secondary | ICD-10-CM

## 2023-07-21 DIAGNOSIS — K51819 Other ulcerative colitis with unspecified complications: Secondary | ICD-10-CM

## 2023-07-21 DIAGNOSIS — K648 Other hemorrhoids: Secondary | ICD-10-CM

## 2023-07-21 DIAGNOSIS — K5100 Ulcerative (chronic) pancolitis without complications: Secondary | ICD-10-CM

## 2023-07-21 MED ORDER — HYDROCORTISONE ACETATE 25 MG RE SUPP
25.0000 mg | Freq: Every day | RECTAL | 1 refills | Status: DC
Start: 2023-07-21 — End: 2023-07-21

## 2023-07-21 MED ORDER — SULFASALAZINE 500 MG PO TABS: 1000.0000 mg | ORAL_TABLET | Freq: Two times a day (BID) | ORAL | 3 refills | Status: AC

## 2023-07-21 MED ORDER — HYDROCORTISONE (PERIANAL) 2.5 % EX CREA: 1.0000 | TOPICAL_CREAM | Freq: Every evening | CUTANEOUS | 1 refills | Status: AC

## 2023-07-21 NOTE — Progress Notes (Signed)
 HISTORY OF PRESENT ILLNESS:  Rachel Henry is a 89 y.o. 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  Also history of GERD complicated by erosive esophagitis.  She was last seen October 03, 2021 regarding her chronic conditions as well as intermittent issues with hemorrhoids.  See that dictation.  She presents today for follow-up.  She is accompanied by her son.  GI wise she is doing well.  No bowel complaints.  Hemorrhoids have been doing well.  She does request a refill of her sulfasalazine  and Anusol  suppositories.  Last colonoscopy 2021.  She is aged out of surveillance.  I was sorry to hear that she has lost her eyesight to the point where she cannot read, which she loved dearly.  REVIEW OF SYSTEMS:  All non-GI ROS negative. Past Medical History:  Diagnosis Date   Arthritis    hands - no meds   Basal cell carcinoma 05/23/1997   Post Left Neck (curet 5FU)   Basal cell carcinoma 08/11/2022   Mohs completed on 10/03/22   Basal cell carcinoma 08/11/2022   Right neck needs Mohs   BCC (basal cell carcinoma of skin) 08/09/2002   Left Chin (excision)   BCC (basal cell carcinoma of skin) 07/25/2009   Right Upper Nose   BCC (basal cell carcinoma of skin) 07/25/2009   Right Nostril (ulcerated)   BCC (basal cell carcinoma of skin) 01/06/2012   Upper Forehead (curet and 5FU)   BCC (basal cell carcinoma of skin) 01/06/2012   Left Arm (curet and 5FU)   BCC (basal cell carcinoma of skin) 02/17/2023   Left clavicle - MOHS needed Right forearm - MOHS needed    Cancer (HCC)    skin cancer - face    Cataract    right eye   Colon polyps    adenomatous   Diabetes mellitus without complication (HCC)    newly diagnosed   Diverticulosis    Esophageal stricture    Glaucoma 2011   right   Hemorrhoids    Hiatal hernia    Hyperlipidemia    diet controlled   Hypertension    Macular degeneration of right eye 2014   Nodular basal cell carcinoma  (BCC) 09/05/2020   left post auricle (MOHS) - Mohs completed on 10/29/22   Superficial basal cell carcinoma (BCC) 09/17/2004   Right Jawline,Neck (curet and excision)   Superficial basal cell carcinoma (BCC) 10/21/2016   Right Shoulder (curet and 5FU)   Superficial basal cell carcinoma (BCC) 10/21/2016   Right Cheek (curet and 5FU)   Superficial basal cell carcinoma (BCC) 11/27/2016   Right Deltoid (tx p bx)   Superficial nodular basal cell carcinoma (BCC) 10/16/2015   Right Forearm (recheck 3 month)   Superficial nodular basal cell carcinoma (BCC) 10/21/2016   Left Post Ear (curet and 5FU)   Superficial nodular basal cell carcinoma (BCC) 05/18/2017   Right Cheek (MOH's)   SVD (spontaneous vaginal delivery)    x 3   Ulcerative colitis (HCC)    Ulcerative esophagitis    Varicose veins     Past Surgical History:  Procedure Laterality Date   ANTERIOR AND POSTERIOR REPAIR N/A 08/24/2013   Procedure: ANTERIOR (CYSTOCELE) AND POSTERIOR REPAIR (RECTOCELE);  Surgeon: Delon CHRISTELLA Prude, DO;  Location: [facility];  Service: Gynecology;  Laterality: N/A;   APPENDECTOMY     CATARACT EXTRACTION     bilateral   COLONOSCOPY     LAPAROSCOPIC ASSISTED VAGINAL HYSTERECTOMY N/A 08/24/2013  Procedure: LAPAROSCOPIC ASSISTED VAGINAL HYSTERECTOMY;  Surgeon: Delon CHRISTELLA Prude, DO;  Location: [facility];  Service: Gynecology;  Laterality: N/A;   OVARIAN CYST REMOVAL  1966   SALPINGOOPHORECTOMY N/A 08/24/2013   Procedure: SALPINGO OOPHORECTOMY;  Surgeon: Delon CHRISTELLA Prude, DO;  Location: [facility];  Service: Gynecology;  Laterality: N/A;   TONSILLECTOMY AND ADENOIDECTOMY     TUBAL LIGATION     WRIST SURGERY     right    Social History Rachel Henry  reports that she quit smoking about 38 years ago. Her smoking use included cigarettes. She has never used smokeless tobacco. She reports that she does not drink alcohol and does not use drugs.  family history includes Diabetes in her mother; Heart disease in her father and  mother; Liver disease in her brother.  No Known Allergies     PHYSICAL EXAMINATION: Vital signs: BP (!) 124/54 (BP Location: Left Arm, Patient Position: Sitting, Cuff Size: Normal)   Pulse 72   Ht 5' (1.524 m)   Wt 135 lb 8 oz (61.5 kg)   BMI 26.46 kg/m   Constitutional: Elderly but generally well-appearing, no acute distress.  Central tremor Psychiatric: alert and oriented x3, cooperative Eyes: extraocular movements intact, anicteric, conjunctiva pink Mouth: oral pharynx moist, no lesions Neck: supple no lymphadenopathy Cardiovascular: heart regular rate and rhythm, no murmur Lungs: clear to auscultation bilaterally Abdomen: soft, nontender, nondistended, no obvious ascites, no peritoneal signs, normal bowel sounds, no organomegaly Rectal: Omitted Extremities: no clubbing, cyanosis, or lower extremity edema bilaterally Skin: no lesions on visible extremities Neuro: No focal deficits.   Expand All Collapse All HISTORY OF PRESENT ILLNESS:   Rachel Henry is a 89 y.o. female with a remote history of chronic universal colitis who has been in deep remission for some time.  Also history of GERD complicated by erosive esophagitis.  She was last seen via telehealth medicine May 31, 2018.  Patient presents today for follow-up and request medication refill.  She continues on sulfasalazine  1000 mg twice daily.  She reports normal bowel habits.  No abdominal pain.   She does report problems with rectal bleeding over the past 6 months.  Generally bright red and on the tissue.  Occasionally seen on the stool and toilet bowl.  No associated rectal discomfort.  Last complete colonoscopy July 27, 2013.  Examination was normal except for sigmoid diverticulosis.  She is known to have internal hemorrhoids.  Biopsy showed unremarkable colonic mucosa.   REVIEW OF SYSTEMS:   All non-GI ROS negative unless otherwise stated in the HPI.       Past Medical History:  Diagnosis Date   Arthritis       hands - no meds   Basal cell carcinoma 05/23/1997    Post Left Neck (curet 5FU)   BCC (basal cell carcinoma of skin) 08/09/2002    Left Chin (excision)   BCC (basal cell carcinoma of skin) 07/25/2009    Right Upper Nose   BCC (basal cell carcinoma of skin) 07/25/2009    Right Nostril (ulcerated)   BCC (basal cell carcinoma of skin) 01/06/2012    Upper Forehead (curet and 5FU)   BCC (basal cell carcinoma of skin) 01/06/2012    Left Arm (curet and 5FU)   Cancer (HCC)      skin cancer - face    Cataract      right eye   Colon polyps      adenomatous   Diabetes mellitus without complication (HCC)  newly diagnosed   Diverticulosis     Esophageal stricture     Glaucoma 2011    right   Hemorrhoids     Hiatal hernia     Hyperlipidemia      diet controlled   Hypertension     Macular degeneration of right eye 2014   Nodular basal cell carcinoma (BCC) 09/05/2020    left post auricle (MOHS)   Superficial basal cell carcinoma (BCC) 09/17/2004    Right Jawline,Neck (curet and excision)   Superficial basal cell carcinoma (BCC) 10/21/2016    Right Shoulder (curet and 5FU)   Superficial basal cell carcinoma (BCC) 10/21/2016    Right Cheek (curet and 5FU)   Superficial basal cell carcinoma (BCC) 11/27/2016    Right Deltoid (tx p bx)   Superficial nodular basal cell carcinoma (BCC) 10/16/2015    Right Forearm (recheck 3 month)   Superficial nodular basal cell carcinoma (BCC) 10/21/2016    Left Post Ear (curet and 5FU)   Superficial nodular basal cell carcinoma (BCC) 05/18/2017    Right Cheek (MOH's)   SVD (spontaneous vaginal delivery)      x 3   Ulcerative colitis (HCC)     Ulcerative esophagitis     Varicose veins                 Past Surgical History:  Procedure Laterality Date   ANTERIOR AND POSTERIOR REPAIR N/A 08/24/2013    Procedure: ANTERIOR (CYSTOCELE) AND POSTERIOR REPAIR (RECTOCELE);  Surgeon: Delon CHRISTELLA Prude, DO;  Location: [facility];  Service: Gynecology;   Laterality: N/A;   APPENDECTOMY       CATARACT EXTRACTION        bilateral   COLONOSCOPY       LAPAROSCOPIC ASSISTED VAGINAL HYSTERECTOMY N/A 08/24/2013    Procedure: LAPAROSCOPIC ASSISTED VAGINAL HYSTERECTOMY;  Surgeon: Delon CHRISTELLA Prude, DO;  Location: [facility];  Service: Gynecology;  Laterality: N/A;   OVARIAN CYST REMOVAL   1966   SALPINGOOPHORECTOMY N/A 08/24/2013    Procedure: SALPINGO OOPHORECTOMY;  Surgeon: Delon CHRISTELLA Prude, DO;  Location: [facility];  Service: Gynecology;  Laterality: N/A;   TONSILLECTOMY AND ADENOIDECTOMY       TUBAL LIGATION       WRIST SURGERY        right          Social History Rachel Henry  reports that she quit smoking about 36 years ago. Her smoking use included cigarettes. She smoked an average of .25 packs per day. She has never used smokeless tobacco. She reports that she does not drink alcohol and does not use drugs.   family history includes Diabetes in her mother; Heart disease in her father and mother; Liver disease in her brother.   Allergies  No Known Allergies         PHYSICAL EXAMINATION: Vital signs: BP 124/68   Pulse 67   Ht 5' 3 (1.6 m)   Wt 154 lb (69.9 kg)   SpO2 96%   BMI 27.28 kg/m   Constitutional: generally well-appearing, no acute distress Psychiatric: alert and oriented x3, cooperative Eyes: extraocular movements intact, anicteric, conjunctiva pink Mouth: oral pharynx moist, no lesions Neck: supple no lymphadenopathy Cardiovascular: heart regular rate and rhythm, no murmur Lungs: clear to auscultation bilaterally Abdomen: soft, nontender, nondistended, no obvious ascites, no peritoneal signs, normal bowel sounds, no organomegaly Rectal: Noninflamed external hemorrhoids.  Inflamed moderate-sized internal hemorrhoids. Extremities: no clubbing, cyanosis, or lower extremity edema bilaterally Skin: no lesions  on visible extremities Neuro: No focal deficits. No asterixis.        ASSESSMENT:   1.  Remote history of  universal ulcerative colitis.  Has been a deep remission for some time.  Last colonoscopy 2015 was normal endoscopically and microscopically. 2.  Recent problems with minor rectal bleeding.  Due to hemorrhoids 3.  GERD.  On no medications.  Asymptomatic     PLAN:   1.  Refill sulfasalazine .  Medication risk reviewed 2.  Prescribed Anusol  HC medicated suppositories for hemorrhoids 3.  Reflux precautions 4.  Routine office follow-up 2 years A total time of 25 minutes was spent preparing to see the patient, performing comprehensive history, performing medically appropriate physical examination, counseling and educating the patient regarding the above listed issues, ordering multiple medications, and documenting clinical information in the health record.

## 2023-07-21 NOTE — Patient Instructions (Signed)
 We have sent the following medications to your pharmacy for you to pick up at your convenience:  Anusol  cream, Sulfasalazine   Please follow up in one year  _______________________________________________________  If your blood pressure at your visit was 140/90 or greater, please contact your primary care physician to follow up on this.  _______________________________________________________  If you are age 88 or older, your body mass index should be between 23-30. Your Body mass index is 26.46 kg/m. If this is out of the aforementioned range listed, please consider follow up with your Primary Care Provider.  If you are age 49 or younger, your body mass index should be between 19-25. Your Body mass index is 26.46 kg/m. If this is out of the aformentioned range listed, please consider follow up with your Primary Care Provider.   ________________________________________________________  The [facility] providers would like to encourage you to use MYCHART to communicate with providers for non-urgent requests or questions.  Due to long hold times on the telephone, sending your provider a message by [facility] may be a faster and more efficient way to get a response.  Please allow 48 business hours for a response.  Please remember that this is for non-urgent requests.  _______________________________________________________

## 2023-09-16 DIAGNOSIS — H401113 Primary open-angle glaucoma, right eye, severe stage: Secondary | ICD-10-CM | POA: Diagnosis not present

## 2023-09-16 DIAGNOSIS — H401122 Primary open-angle glaucoma, left eye, moderate stage: Secondary | ICD-10-CM | POA: Diagnosis not present

## 2023-12-16 DIAGNOSIS — E1122 Type 2 diabetes mellitus with diabetic chronic kidney disease: Secondary | ICD-10-CM | POA: Diagnosis not present

## 2023-12-16 DIAGNOSIS — I129 Hypertensive chronic kidney disease with stage 1 through stage 4 chronic kidney disease, or unspecified chronic kidney disease: Secondary | ICD-10-CM | POA: Diagnosis not present

## 2024-05-02 ENCOUNTER — Ambulatory Visit: Admitting: Dermatology
